# Patient Record
Sex: Female | Born: 1935 | Race: White | Hispanic: No | Marital: Married | Smoking: Former smoker
Health system: Southern US, Community
[De-identification: ages and names within clinical notes are randomized; demographics above are authoritative.]

## PROBLEM LIST (undated history)

## (undated) DIAGNOSIS — I4819 Other persistent atrial fibrillation: Secondary | ICD-10-CM

## (undated) DIAGNOSIS — H3530 Unspecified macular degeneration: Secondary | ICD-10-CM

## (undated) DIAGNOSIS — Q2112 Patent foramen ovale: Secondary | ICD-10-CM

## (undated) DIAGNOSIS — I340 Nonrheumatic mitral (valve) insufficiency: Secondary | ICD-10-CM

## (undated) DIAGNOSIS — Q211 Atrial septal defect: Secondary | ICD-10-CM

## (undated) DIAGNOSIS — N183 Chronic kidney disease, stage 3 (moderate): Secondary | ICD-10-CM

## (undated) DIAGNOSIS — Z8673 Personal history of transient ischemic attack (TIA), and cerebral infarction without residual deficits: Secondary | ICD-10-CM

## (undated) DIAGNOSIS — I5032 Chronic diastolic (congestive) heart failure: Principal | ICD-10-CM

## (undated) HISTORY — DX: Chronic diastolic (congestive) heart failure: I50.32

## (undated) HISTORY — DX: Patent foramen ovale: Q21.12

## (undated) HISTORY — DX: Other persistent atrial fibrillation: I48.19

## (undated) HISTORY — DX: Nonrheumatic mitral (valve) insufficiency: I34.0

## (undated) HISTORY — DX: Chronic kidney disease, stage 3 (moderate): N18.3

## (undated) HISTORY — PX: HERNIA REPAIR: SHX51

## (undated) HISTORY — DX: Personal history of transient ischemic attack (TIA), and cerebral infarction without residual deficits: Z86.73

## (undated) HISTORY — DX: Atrial septal defect: Q21.1

---

## 1997-05-04 ENCOUNTER — Other Ambulatory Visit: Admission: RE | Admit: 1997-05-04 | Discharge: 1997-05-04 | Payer: Self-pay | Admitting: Obstetrics and Gynecology

## 1998-12-10 ENCOUNTER — Encounter: Admission: RE | Admit: 1998-12-10 | Discharge: 1998-12-10 | Payer: Self-pay | Admitting: Obstetrics and Gynecology

## 1998-12-10 ENCOUNTER — Encounter: Payer: Self-pay | Admitting: Obstetrics and Gynecology

## 1998-12-31 ENCOUNTER — Other Ambulatory Visit: Admission: RE | Admit: 1998-12-31 | Discharge: 1998-12-31 | Payer: Self-pay | Admitting: Obstetrics and Gynecology

## 1999-08-08 ENCOUNTER — Inpatient Hospital Stay (HOSPITAL_COMMUNITY): Admission: AD | Admit: 1999-08-08 | Discharge: 1999-08-13 | Payer: Self-pay | Admitting: Internal Medicine

## 1999-08-08 ENCOUNTER — Encounter: Payer: Self-pay | Admitting: Internal Medicine

## 1999-12-16 ENCOUNTER — Encounter: Admission: RE | Admit: 1999-12-16 | Discharge: 1999-12-16 | Payer: Self-pay | Admitting: Obstetrics and Gynecology

## 1999-12-16 ENCOUNTER — Encounter: Payer: Self-pay | Admitting: Obstetrics and Gynecology

## 2000-02-10 ENCOUNTER — Other Ambulatory Visit: Admission: RE | Admit: 2000-02-10 | Discharge: 2000-02-10 | Payer: Self-pay | Admitting: Obstetrics and Gynecology

## 2000-12-07 ENCOUNTER — Encounter: Payer: Self-pay | Admitting: Obstetrics and Gynecology

## 2000-12-07 ENCOUNTER — Encounter: Admission: RE | Admit: 2000-12-07 | Discharge: 2000-12-07 | Payer: Self-pay | Admitting: Obstetrics and Gynecology

## 2001-04-25 ENCOUNTER — Other Ambulatory Visit: Admission: RE | Admit: 2001-04-25 | Discharge: 2001-04-25 | Payer: Self-pay | Admitting: Obstetrics and Gynecology

## 2001-12-28 ENCOUNTER — Encounter: Admission: RE | Admit: 2001-12-28 | Discharge: 2001-12-28 | Payer: Self-pay | Admitting: Obstetrics and Gynecology

## 2001-12-28 ENCOUNTER — Encounter: Payer: Self-pay | Admitting: Obstetrics and Gynecology

## 2002-04-13 ENCOUNTER — Encounter: Admission: RE | Admit: 2002-04-13 | Discharge: 2002-04-13 | Payer: Self-pay | Admitting: Internal Medicine

## 2002-04-13 ENCOUNTER — Encounter: Payer: Self-pay | Admitting: Internal Medicine

## 2002-11-02 ENCOUNTER — Ambulatory Visit (HOSPITAL_COMMUNITY): Admission: RE | Admit: 2002-11-02 | Discharge: 2002-11-02 | Payer: Self-pay | Admitting: Gastroenterology

## 2003-02-07 ENCOUNTER — Encounter: Admission: RE | Admit: 2003-02-07 | Discharge: 2003-02-07 | Payer: Self-pay | Admitting: Obstetrics and Gynecology

## 2004-01-27 HISTORY — PX: PARTIAL HYSTERECTOMY: SHX80

## 2004-02-04 ENCOUNTER — Ambulatory Visit (HOSPITAL_COMMUNITY): Admission: RE | Admit: 2004-02-04 | Discharge: 2004-02-04 | Payer: Self-pay | Admitting: Gastroenterology

## 2004-03-13 ENCOUNTER — Encounter: Admission: RE | Admit: 2004-03-13 | Discharge: 2004-03-13 | Payer: Self-pay | Admitting: Obstetrics and Gynecology

## 2005-02-03 ENCOUNTER — Ambulatory Visit (HOSPITAL_COMMUNITY): Admission: RE | Admit: 2005-02-03 | Discharge: 2005-02-03 | Payer: Self-pay | Admitting: Gastroenterology

## 2005-03-16 ENCOUNTER — Encounter: Admission: RE | Admit: 2005-03-16 | Discharge: 2005-03-16 | Payer: Self-pay | Admitting: Obstetrics and Gynecology

## 2005-12-09 ENCOUNTER — Ambulatory Visit (HOSPITAL_COMMUNITY): Admission: RE | Admit: 2005-12-09 | Discharge: 2005-12-09 | Payer: Self-pay | Admitting: Obstetrics and Gynecology

## 2006-01-06 ENCOUNTER — Ambulatory Visit: Admission: RE | Admit: 2006-01-06 | Discharge: 2006-01-06 | Payer: Self-pay | Admitting: Gynecology

## 2006-01-12 ENCOUNTER — Encounter (INDEPENDENT_AMBULATORY_CARE_PROVIDER_SITE_OTHER): Payer: Self-pay | Admitting: *Deleted

## 2006-01-12 ENCOUNTER — Inpatient Hospital Stay (HOSPITAL_COMMUNITY): Admission: RE | Admit: 2006-01-12 | Discharge: 2006-01-15 | Payer: Self-pay | Admitting: Obstetrics and Gynecology

## 2006-02-24 ENCOUNTER — Ambulatory Visit: Admission: RE | Admit: 2006-02-24 | Discharge: 2006-02-24 | Payer: Self-pay | Admitting: Gynecologic Oncology

## 2006-03-23 ENCOUNTER — Encounter: Admission: RE | Admit: 2006-03-23 | Discharge: 2006-03-23 | Payer: Self-pay | Admitting: Obstetrics and Gynecology

## 2006-05-10 ENCOUNTER — Encounter: Admission: RE | Admit: 2006-05-10 | Discharge: 2006-05-10 | Payer: Self-pay | Admitting: Internal Medicine

## 2006-05-18 ENCOUNTER — Encounter: Admission: RE | Admit: 2006-05-18 | Discharge: 2006-05-18 | Payer: Self-pay | Admitting: Internal Medicine

## 2006-06-02 ENCOUNTER — Inpatient Hospital Stay (HOSPITAL_COMMUNITY): Admission: RE | Admit: 2006-06-02 | Discharge: 2006-06-03 | Payer: Self-pay | Admitting: Gastroenterology

## 2007-04-07 ENCOUNTER — Encounter: Admission: RE | Admit: 2007-04-07 | Discharge: 2007-04-07 | Payer: Self-pay | Admitting: Obstetrics and Gynecology

## 2008-04-10 ENCOUNTER — Encounter: Admission: RE | Admit: 2008-04-10 | Discharge: 2008-04-10 | Payer: Self-pay | Admitting: Obstetrics and Gynecology

## 2009-04-24 ENCOUNTER — Encounter: Admission: RE | Admit: 2009-04-24 | Discharge: 2009-04-24 | Payer: Self-pay | Admitting: Obstetrics and Gynecology

## 2010-04-08 ENCOUNTER — Other Ambulatory Visit: Payer: Self-pay | Admitting: Obstetrics and Gynecology

## 2010-04-08 DIAGNOSIS — Z1231 Encounter for screening mammogram for malignant neoplasm of breast: Secondary | ICD-10-CM

## 2010-04-29 ENCOUNTER — Ambulatory Visit
Admission: RE | Admit: 2010-04-29 | Discharge: 2010-04-29 | Disposition: A | Discharge status: Home / Self Care | Payer: Medicare Other | Source: Ambulatory Visit | Attending: Obstetrics and Gynecology | Admitting: Obstetrics and Gynecology

## 2010-04-29 DIAGNOSIS — Z1231 Encounter for screening mammogram for malignant neoplasm of breast: Secondary | ICD-10-CM

## 2010-04-30 ENCOUNTER — Other Ambulatory Visit: Payer: Self-pay | Admitting: Obstetrics and Gynecology

## 2010-04-30 DIAGNOSIS — R928 Other abnormal and inconclusive findings on diagnostic imaging of breast: Secondary | ICD-10-CM

## 2010-05-08 ENCOUNTER — Ambulatory Visit
Admission: RE | Admit: 2010-05-08 | Discharge: 2010-05-08 | Disposition: A | Discharge status: Home / Self Care | Payer: Medicare Other | Source: Ambulatory Visit | Attending: Obstetrics and Gynecology | Admitting: Obstetrics and Gynecology

## 2010-05-08 DIAGNOSIS — R928 Other abnormal and inconclusive findings on diagnostic imaging of breast: Secondary | ICD-10-CM

## 2010-06-13 NOTE — Consult Note (Signed)
NAME:  Laura Diaz, Laura Diaz NO.:  [id number]   MEDICAL RECORD NO.:  [id number]         PATIENT TYPE:  LOUT   LOCATION:                               FACILITY:  [facility]   PHYSICIAN:  Paola A. Duard Brady, MD    DATE OF BIRTH:  05-Jul-1922   DATE OF CONSULTATION:  02/24/2006  DATE OF DISCHARGE:                                 CONSULTATION   Laura Diaz is an 77 year old who underwent a TAH-BSO January 06, 2006 by  Dr. Stanford Breed.  She had a pelvic mass noted at the time of her  routine GYN care by Dr. Ambrose Mantle.  Ultrasound and CT scan revealed a 4.5 x  5 cm complex mass.  There is also a questionable right external iliac  lymph node versus fluid density measuring 3.8 x 2.6 cm.  The patient was  essentially asymptomatic.  Her CA-125 was 8.4.  Because of the complex  nature of the postmenopausal patient she underwent exploratory  laparotomy with TAH-BSO.  Final pathology was consistent within the  right ovary.  A serous cyst adenofibroma measuring 6.5 cm.  She had a  benign fallopian tube with a paratubal cyst on the right side.  The  cervix revealed chronic cervicitis, no intraepithelial lesion, the  endometrium was an active with benign endometrial polyps measuring 1.2  cm and multiple myomas.  The left ovary and tube were negative.  Washings were unremarkable.  She comes in, today, for her postoperative  check.  She states she feels about 70-80% of herself and really denies  any significant complaints.  She denied any chest pain, short of breath,  nausea, vomiting, fevers, chills, or pain.  She states that the biggest  issues is fatigue.  She has normal return of her bowel and bladder  function; and denies any vaginal bleeding.   PHYSICAL EXAMINATION:  VITAL SIGNS:  Weight 176 pounds, blood pressure  160/100.  GENERAL:  A well-nourished, well-developed female in no acute distress.  ABDOMEN:  Soft and nontender.  PELVIC:  External genitalia is within normal limits though  atrophic.  The vagina is markedly atrophic.  The vaginal cuff is visualized.  It is  well-healed.  Bimanual examination reveals no masses or nodularity.   ASSESSMENT:  An 77 year old status post TAH-BSO for benign disease.   PLAN:  1. She can return to her usual activities.  I have encouraged to do no      heavy lifting of greater than 10 pounds an additional month to      decrease the hernia risk.  She will be followed up for her routine      surveillance by Dr. Ambrose Mantle for routine GYN care.  2. Her blood pressure is elevated today.  She is currently not on any      antihypertensive, I have recommended that she follow up with her      primary physician regarding this; and she will do so.  Her      questions were elicited and answered to her satisfaction.  She was      seen with her  daughter present.      Paola A. Duard Brady, MD  Electronically Signed     PAG/MEDQ  D:  02/24/2006  T:  02/24/2006  Job:  [id number]   cc:   Malachi Pro. Ambrose Mantle, M.D.  Fax: [phone]   Theressa Millard, M.D.  Fax: [phone]   Telford Nab, R.N.  [address]  [place], [place] [address]

## 2010-06-13 NOTE — Discharge Summary (Signed)
NAME:  MANREET, KIERNAN NO.:  [id number]   MEDICAL RECORD NO.:  [id number]          PATIENT TYPE:  INP   LOCATION:  1612                         FACILITY:  [facility]   PHYSICIAN:  Malachi Pro. Ambrose Mantle, M.D. DATE OF BIRTH:  May 01, 1922   DATE OF ADMISSION:  01/12/2006  DATE OF DISCHARGE:  01/15/2006                               DISCHARGE SUMMARY   This is an 77 year old white female admitted for surgical exploration of  a pelvic mass.  The patient underwent abdominal hysterectomy, bilateral  salpingo-oophorectomy by Dr. Serita Kyle with Dr. Ambrose Mantle  assisting under general anesthesia.  Frozen section on the enlarged  ovary showed a serous cyst adenofibroma, no evidence of malignancy.  The  patient has done well postoperatively.  She has remained afebrile, and  her abdomen has remained soft and nontender.  She has tolerated liquids  and some diet and some solid food and is ready for discharge on the  third postop day.  The incision is healing well.  It is a midline  incision from the pubis to the umbilicus.  There is some surrounding  ecchymosis, no sign of infection.  Her initial hemoglobin was 15.8,  hematocrit 45.3, white count 4600, platelet count 226,000.  Follow-up  hemoglobin 12.8, normal differential.  Comprehensive metabolic profile  on admission was normal.  Blood group and type was A+ with a negative  antibody.  Path report showed right ovary and fallopian tube, ovary with  serous cyst adenofibroma 6.5 cm, benign fallopian tube with paratubal  cyst.  Uterus, cervix, left ovary, and fallopian tube showed chronic  cervicitis with squamous metaplasia on the cervix.  No intraepithelial  lesion identified.  Endometrium, inactive endometrium with benign  endometrial polyp of 1.2 cm.  Myometrium with adenomyosis and  leiomyomas, left ovary with inclusion cysts, surface adhesions, and  benign fallopian tube.  Peritoneal washing showed no malignant cells.  Chest  x-ray showed mild cardiomegaly, bibasilar opacities, likely  representing atelectasis, but particularly on the lateral projection are  nonspecific.  Correlate with any fever or leukocytosis in assessing for  the possibility of early pneumonia.  Recommend followup chest  radiography to ensure clearance.  If the patient has a personal history  of malignancy, I would recommend CT.  EKG showed sinus bradycardia, low  voltage QRS septal infarct age undetermined.   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   OPERATION:  Abdominal hysterectomy, bilateral salpingo-oophorectomy.   FINAL CONDITION:  Improved.   INSTRUCTIONS:  No vaginal entrance, no heavy lifting or strenuous  activity.  Call with any fever greater than 100.4 degrees.  Call with  any unusual problems.  The patient is to resume her Zyrtec, Nasonex,  Singulair, calcium, vitamin D, and Centrum Silver as directed.  She is  to resume her Coumadin on the regular dosage schedule.  She takes 2.5 mg  every day except Thursdays and Sundays when she takes 5 mg.  She is to  follow up with me in less than 1 week to have her staples removed unless  arrangements are made for  her to be followed up by Dr. Marcene Corning-  Sharol Given and his staff.  Percocet 5/325, 36 tablets 1 every 4-6 hours as  needed for pain is given at discharge.      Malachi Pro. Ambrose Mantle, M.D.  Electronically Signed    TFH/MEDQ  D:  01/15/2006  T:  01/15/2006  Job:  [id number]

## 2010-06-13 NOTE — Op Note (Signed)
NAME:  ISRAELLA, HUBERT NO.:  [id number]   MEDICAL RECORD NO.:  [id number]          PATIENT TYPE:  INP   LOCATION:  0002                         FACILITY:  [facility]   PHYSICIAN:  De Blanch, M.D.DATE OF BIRTH:  1922/06/08   DATE OF PROCEDURE:  01/12/2006  DATE OF DISCHARGE:                               OPERATIVE REPORT   PREOPERATIVE DIAGNOSIS:  Complex right adnexal mass.   POSTOPERATIVE DIAGNOSIS:  Serous cyst adenoma of the right ovary.   PROCEDURE:  Total abdominal hysterectomy, bilateral salpingo-  oophorectomy.   SURGEON:  De Blanch, M.D.   FIRST ASSISTANT:  Tracey Harries, M.D., Telford Nab, R.N.   ANESTHESIA:  General with orotracheal tube.   ESTIMATED BLOOD LOSS:  50 mL.   SURGICAL FINDINGS:  At the time of exploratory laparotomy, the upper  abdomen including the liver, spleen, stomach, omentum and diaphragms  were normal.  The right ovary was replaced by a smooth cystic mass  approximately 6 cm in diameter.  This was freely mobile.  On frozen  section, this was determined be a serous cyst adenofibroma.   The uterus and other tube and ovary appeared normal.  There was no  evidence of adenopathy.   DESCRIPTION OF PROCEDURE:  The patient was brought to the operating room  and after satisfactory attainment of general anesthesia was placed in  the modified lithotomy position in [place] stirrups.  The anterior  abdominal wall, perineum and vagina were prepped with Betadine, a Foley  catheter was inserted and the patient was draped.  The abdomen was  entered through a low midline incision.  Peritoneal washings were  obtained.  The upper abdomen and pelvis were explored with the above-  noted findings.  The uterus was grasped with long Kelly clamps and the  round ligaments were divided.  The retroperitoneal spaces were opened.  The previously described enlarged right pelvic lymph node could not be  identified at the time of  retroperitoneal exploration.  The ureter was  identified.  The ovarian vessels were skeletonized, clamped, cut, free  tied and suture ligated.  The right cystic ovary and tube were doubly  clamped at the uterine cornu, transected and submitted to frozen section  with the above-noted findings.  A similar procedure was performed on the  left side of the pelvis.  The bladder flap was advanced with sharp and  blunt dissection.  The uterine vessels were skeletonized, clamped, cut  and suture ligated. In a stepwise fashion, the paracervical and cardinal  ligaments and vaginal angles were clamped, cut and suture ligated.  The  vagina was transected from its junction with the cervix.  The vaginal  angles were transfixed with #0 Vicryl and the central portion of the  vagina closed with interrupted figure-of-eight sutures of #0 Vicryl.  The pelvis was irrigated and found to be hemostatic.  Packs and  retractors were removed.  The anterior abdominal wall was then closed in  layers, the first being a running mass closure using #1 PDS  incorporating fascia muscle and peritoneum.  The subcutaneous  tissue was irrigated,  hemostasis achieved with cautery.  The skin was  closed with skin staples.  A dressing was applied.  The patient was  awakened from anesthesia, and taken to the recovery room in satisfactory  condition.  Sponge, needle and instrument counts correct x2.      De Blanch, M.D.  Electronically Signed     DC/MEDQ  D:  01/12/2006  T:  01/12/2006  Job:  [id number]   cc:   Malachi Pro. Ambrose Mantle, M.D.  Fax: [phone]   Telford Nab, R.N.  [address]  [place], [place] [address]

## 2010-11-18 ENCOUNTER — Other Ambulatory Visit: Payer: Self-pay | Admitting: Dermatology

## 2011-02-02 DIAGNOSIS — Z7901 Long term (current) use of anticoagulants: Secondary | ICD-10-CM | POA: Diagnosis not present

## 2011-03-02 DIAGNOSIS — Z7901 Long term (current) use of anticoagulants: Secondary | ICD-10-CM | POA: Diagnosis not present

## 2011-03-30 DIAGNOSIS — Z7901 Long term (current) use of anticoagulants: Secondary | ICD-10-CM | POA: Diagnosis not present

## 2011-05-04 DIAGNOSIS — Z7901 Long term (current) use of anticoagulants: Secondary | ICD-10-CM | POA: Diagnosis not present

## 2011-05-25 ENCOUNTER — Other Ambulatory Visit: Payer: Self-pay | Admitting: Obstetrics and Gynecology

## 2011-05-25 DIAGNOSIS — Z1231 Encounter for screening mammogram for malignant neoplasm of breast: Secondary | ICD-10-CM

## 2011-06-02 ENCOUNTER — Ambulatory Visit: Payer: Medicare Other

## 2011-06-11 DIAGNOSIS — Z9181 History of falling: Secondary | ICD-10-CM | POA: Diagnosis not present

## 2011-06-11 DIAGNOSIS — F05 Delirium due to known physiological condition: Secondary | ICD-10-CM | POA: Diagnosis not present

## 2011-06-11 DIAGNOSIS — Z7901 Long term (current) use of anticoagulants: Secondary | ICD-10-CM | POA: Diagnosis not present

## 2011-06-11 DIAGNOSIS — H9190 Unspecified hearing loss, unspecified ear: Secondary | ICD-10-CM | POA: Diagnosis not present

## 2011-06-11 DIAGNOSIS — F329 Major depressive disorder, single episode, unspecified: Secondary | ICD-10-CM | POA: Diagnosis not present

## 2011-06-11 DIAGNOSIS — M545 Low back pain, unspecified: Secondary | ICD-10-CM | POA: Diagnosis not present

## 2011-06-11 DIAGNOSIS — J309 Allergic rhinitis, unspecified: Secondary | ICD-10-CM | POA: Diagnosis not present

## 2011-06-11 DIAGNOSIS — E7800 Pure hypercholesterolemia, unspecified: Secondary | ICD-10-CM | POA: Diagnosis not present

## 2011-06-16 ENCOUNTER — Ambulatory Visit
Admission: RE | Admit: 2011-06-16 | Discharge: 2011-06-16 | Disposition: A | Discharge status: Home / Self Care | Payer: Medicare Other | Source: Ambulatory Visit | Attending: Obstetrics and Gynecology | Admitting: Obstetrics and Gynecology

## 2011-06-16 DIAGNOSIS — Z1231 Encounter for screening mammogram for malignant neoplasm of breast: Secondary | ICD-10-CM

## 2011-07-09 DIAGNOSIS — H6120 Impacted cerumen, unspecified ear: Secondary | ICD-10-CM | POA: Diagnosis not present

## 2011-07-09 DIAGNOSIS — H9190 Unspecified hearing loss, unspecified ear: Secondary | ICD-10-CM | POA: Diagnosis not present

## 2011-07-13 DIAGNOSIS — Z7901 Long term (current) use of anticoagulants: Secondary | ICD-10-CM | POA: Diagnosis not present

## 2011-08-10 DIAGNOSIS — Z7901 Long term (current) use of anticoagulants: Secondary | ICD-10-CM | POA: Diagnosis not present

## 2011-09-08 DIAGNOSIS — Z7901 Long term (current) use of anticoagulants: Secondary | ICD-10-CM | POA: Diagnosis not present

## 2011-09-14 DIAGNOSIS — M545 Low back pain, unspecified: Secondary | ICD-10-CM | POA: Diagnosis not present

## 2011-09-14 DIAGNOSIS — F05 Delirium due to known physiological condition: Secondary | ICD-10-CM | POA: Diagnosis not present

## 2011-09-14 DIAGNOSIS — F329 Major depressive disorder, single episode, unspecified: Secondary | ICD-10-CM | POA: Diagnosis not present

## 2011-10-06 DIAGNOSIS — Z7901 Long term (current) use of anticoagulants: Secondary | ICD-10-CM | POA: Diagnosis not present

## 2011-11-03 DIAGNOSIS — Z7901 Long term (current) use of anticoagulants: Secondary | ICD-10-CM | POA: Diagnosis not present

## 2011-11-25 DIAGNOSIS — Z7901 Long term (current) use of anticoagulants: Secondary | ICD-10-CM | POA: Diagnosis not present

## 2011-12-10 DIAGNOSIS — Z7901 Long term (current) use of anticoagulants: Secondary | ICD-10-CM | POA: Diagnosis not present

## 2012-01-07 DIAGNOSIS — Z7901 Long term (current) use of anticoagulants: Secondary | ICD-10-CM | POA: Diagnosis not present

## 2012-02-04 DIAGNOSIS — Z7901 Long term (current) use of anticoagulants: Secondary | ICD-10-CM | POA: Diagnosis not present

## 2012-03-14 DIAGNOSIS — Z7901 Long term (current) use of anticoagulants: Secondary | ICD-10-CM | POA: Diagnosis not present

## 2012-04-13 DIAGNOSIS — Z7901 Long term (current) use of anticoagulants: Secondary | ICD-10-CM | POA: Diagnosis not present

## 2012-04-13 DIAGNOSIS — E7800 Pure hypercholesterolemia, unspecified: Secondary | ICD-10-CM | POA: Diagnosis not present

## 2012-04-13 DIAGNOSIS — M549 Dorsalgia, unspecified: Secondary | ICD-10-CM | POA: Diagnosis not present

## 2012-04-13 DIAGNOSIS — Z79899 Other long term (current) drug therapy: Secondary | ICD-10-CM | POA: Diagnosis not present

## 2012-04-13 DIAGNOSIS — F325 Major depressive disorder, single episode, in full remission: Secondary | ICD-10-CM | POA: Diagnosis not present

## 2012-04-14 DIAGNOSIS — Z947 Corneal transplant status: Secondary | ICD-10-CM | POA: Diagnosis not present

## 2012-04-14 DIAGNOSIS — H04569 Stenosis of unspecified lacrimal punctum: Secondary | ICD-10-CM | POA: Diagnosis not present

## 2012-04-14 DIAGNOSIS — H18519 Endothelial corneal dystrophy, unspecified eye: Secondary | ICD-10-CM | POA: Diagnosis not present

## 2012-04-14 DIAGNOSIS — Z961 Presence of intraocular lens: Secondary | ICD-10-CM | POA: Diagnosis not present

## 2012-04-14 DIAGNOSIS — Z9849 Cataract extraction status, unspecified eye: Secondary | ICD-10-CM | POA: Diagnosis not present

## 2012-04-14 DIAGNOSIS — H02139 Senile ectropion of unspecified eye, unspecified eyelid: Secondary | ICD-10-CM | POA: Diagnosis not present

## 2012-04-14 DIAGNOSIS — H02109 Unspecified ectropion of unspecified eye, unspecified eyelid: Secondary | ICD-10-CM | POA: Diagnosis not present

## 2012-05-11 DIAGNOSIS — Z7901 Long term (current) use of anticoagulants: Secondary | ICD-10-CM | POA: Diagnosis not present

## 2012-05-17 DIAGNOSIS — Z01818 Encounter for other preprocedural examination: Secondary | ICD-10-CM | POA: Diagnosis not present

## 2012-05-17 DIAGNOSIS — H02139 Senile ectropion of unspecified eye, unspecified eyelid: Secondary | ICD-10-CM | POA: Diagnosis not present

## 2012-05-17 DIAGNOSIS — H02109 Unspecified ectropion of unspecified eye, unspecified eyelid: Secondary | ICD-10-CM | POA: Diagnosis not present

## 2012-05-17 DIAGNOSIS — H04569 Stenosis of unspecified lacrimal punctum: Secondary | ICD-10-CM | POA: Diagnosis not present

## 2012-05-23 DIAGNOSIS — H02139 Senile ectropion of unspecified eye, unspecified eyelid: Secondary | ICD-10-CM | POA: Diagnosis not present

## 2012-05-23 DIAGNOSIS — F329 Major depressive disorder, single episode, unspecified: Secondary | ICD-10-CM | POA: Diagnosis not present

## 2012-05-23 DIAGNOSIS — H04569 Stenosis of unspecified lacrimal punctum: Secondary | ICD-10-CM | POA: Diagnosis not present

## 2012-05-23 DIAGNOSIS — Z7901 Long term (current) use of anticoagulants: Secondary | ICD-10-CM | POA: Diagnosis not present

## 2012-05-23 DIAGNOSIS — Z8673 Personal history of transient ischemic attack (TIA), and cerebral infarction without residual deficits: Secondary | ICD-10-CM | POA: Diagnosis not present

## 2012-05-23 DIAGNOSIS — H02109 Unspecified ectropion of unspecified eye, unspecified eyelid: Secondary | ICD-10-CM | POA: Diagnosis not present

## 2012-05-23 DIAGNOSIS — F411 Generalized anxiety disorder: Secondary | ICD-10-CM | POA: Diagnosis not present

## 2012-05-31 DIAGNOSIS — H02139 Senile ectropion of unspecified eye, unspecified eyelid: Secondary | ICD-10-CM | POA: Diagnosis not present

## 2012-05-31 DIAGNOSIS — H04569 Stenosis of unspecified lacrimal punctum: Secondary | ICD-10-CM | POA: Diagnosis not present

## 2012-06-08 DIAGNOSIS — Z7901 Long term (current) use of anticoagulants: Secondary | ICD-10-CM | POA: Diagnosis not present

## 2012-06-29 ENCOUNTER — Other Ambulatory Visit: Payer: Self-pay

## 2012-06-29 DIAGNOSIS — Z1231 Encounter for screening mammogram for malignant neoplasm of breast: Secondary | ICD-10-CM

## 2012-07-06 DIAGNOSIS — Z7901 Long term (current) use of anticoagulants: Secondary | ICD-10-CM | POA: Diagnosis not present

## 2012-07-27 ENCOUNTER — Ambulatory Visit
Admission: RE | Admit: 2012-07-27 | Discharge: 2012-07-27 | Disposition: A | Discharge status: Home / Self Care | Payer: Medicare Other | Source: Ambulatory Visit

## 2012-07-27 DIAGNOSIS — Z1231 Encounter for screening mammogram for malignant neoplasm of breast: Secondary | ICD-10-CM

## 2012-08-04 DIAGNOSIS — Z7901 Long term (current) use of anticoagulants: Secondary | ICD-10-CM | POA: Diagnosis not present

## 2012-08-24 DIAGNOSIS — Z0000 Encounter for general adult medical examination without abnormal findings: Secondary | ICD-10-CM | POA: Diagnosis not present

## 2012-08-24 DIAGNOSIS — Z01419 Encounter for gynecological examination (general) (routine) without abnormal findings: Secondary | ICD-10-CM | POA: Diagnosis not present

## 2012-08-31 DIAGNOSIS — Z7901 Long term (current) use of anticoagulants: Secondary | ICD-10-CM | POA: Diagnosis not present

## 2012-09-21 DIAGNOSIS — Z7901 Long term (current) use of anticoagulants: Secondary | ICD-10-CM | POA: Diagnosis not present

## 2012-10-19 DIAGNOSIS — M545 Low back pain, unspecified: Secondary | ICD-10-CM | POA: Diagnosis not present

## 2012-10-19 DIAGNOSIS — J309 Allergic rhinitis, unspecified: Secondary | ICD-10-CM | POA: Diagnosis not present

## 2012-10-19 DIAGNOSIS — F325 Major depressive disorder, single episode, in full remission: Secondary | ICD-10-CM | POA: Diagnosis not present

## 2012-10-19 DIAGNOSIS — N183 Chronic kidney disease, stage 3 unspecified: Secondary | ICD-10-CM | POA: Diagnosis not present

## 2012-10-19 DIAGNOSIS — Z7901 Long term (current) use of anticoagulants: Secondary | ICD-10-CM | POA: Diagnosis not present

## 2012-11-14 DIAGNOSIS — Z7901 Long term (current) use of anticoagulants: Secondary | ICD-10-CM | POA: Diagnosis not present

## 2012-11-14 DIAGNOSIS — Z23 Encounter for immunization: Secondary | ICD-10-CM | POA: Diagnosis not present

## 2012-12-13 DIAGNOSIS — G459 Transient cerebral ischemic attack, unspecified: Secondary | ICD-10-CM | POA: Diagnosis not present

## 2012-12-13 DIAGNOSIS — Z7901 Long term (current) use of anticoagulants: Secondary | ICD-10-CM | POA: Diagnosis not present

## 2013-01-11 DIAGNOSIS — G459 Transient cerebral ischemic attack, unspecified: Secondary | ICD-10-CM | POA: Diagnosis not present

## 2013-01-11 DIAGNOSIS — Z7901 Long term (current) use of anticoagulants: Secondary | ICD-10-CM | POA: Diagnosis not present

## 2013-02-01 DIAGNOSIS — G459 Transient cerebral ischemic attack, unspecified: Secondary | ICD-10-CM | POA: Diagnosis not present

## 2013-02-01 DIAGNOSIS — Z7901 Long term (current) use of anticoagulants: Secondary | ICD-10-CM | POA: Diagnosis not present

## 2013-03-01 DIAGNOSIS — G459 Transient cerebral ischemic attack, unspecified: Secondary | ICD-10-CM | POA: Diagnosis not present

## 2013-03-01 DIAGNOSIS — Z7901 Long term (current) use of anticoagulants: Secondary | ICD-10-CM | POA: Diagnosis not present

## 2013-03-29 DIAGNOSIS — M545 Low back pain, unspecified: Secondary | ICD-10-CM | POA: Diagnosis not present

## 2013-03-29 DIAGNOSIS — G459 Transient cerebral ischemic attack, unspecified: Secondary | ICD-10-CM | POA: Diagnosis not present

## 2013-03-29 DIAGNOSIS — Z7901 Long term (current) use of anticoagulants: Secondary | ICD-10-CM | POA: Diagnosis not present

## 2013-04-19 DIAGNOSIS — M545 Low back pain, unspecified: Secondary | ICD-10-CM | POA: Diagnosis not present

## 2013-04-19 DIAGNOSIS — N183 Chronic kidney disease, stage 3 unspecified: Secondary | ICD-10-CM | POA: Diagnosis not present

## 2013-04-19 DIAGNOSIS — Z7901 Long term (current) use of anticoagulants: Secondary | ICD-10-CM | POA: Diagnosis not present

## 2013-04-19 DIAGNOSIS — F325 Major depressive disorder, single episode, in full remission: Secondary | ICD-10-CM | POA: Diagnosis not present

## 2013-04-19 DIAGNOSIS — Z23 Encounter for immunization: Secondary | ICD-10-CM | POA: Diagnosis not present

## 2013-05-18 DIAGNOSIS — I6340 Cerebral infarction due to embolism of unspecified cerebral artery: Secondary | ICD-10-CM | POA: Diagnosis not present

## 2013-05-18 DIAGNOSIS — Z7901 Long term (current) use of anticoagulants: Secondary | ICD-10-CM | POA: Diagnosis not present

## 2013-06-16 DIAGNOSIS — G459 Transient cerebral ischemic attack, unspecified: Secondary | ICD-10-CM | POA: Diagnosis not present

## 2013-06-16 DIAGNOSIS — Z7901 Long term (current) use of anticoagulants: Secondary | ICD-10-CM | POA: Diagnosis not present

## 2013-06-23 DIAGNOSIS — Z7901 Long term (current) use of anticoagulants: Secondary | ICD-10-CM | POA: Diagnosis not present

## 2013-06-23 DIAGNOSIS — I6340 Cerebral infarction due to embolism of unspecified cerebral artery: Secondary | ICD-10-CM | POA: Diagnosis not present

## 2013-07-07 DIAGNOSIS — Z7901 Long term (current) use of anticoagulants: Secondary | ICD-10-CM | POA: Diagnosis not present

## 2013-07-07 DIAGNOSIS — I6340 Cerebral infarction due to embolism of unspecified cerebral artery: Secondary | ICD-10-CM | POA: Diagnosis not present

## 2013-07-21 DIAGNOSIS — Z7901 Long term (current) use of anticoagulants: Secondary | ICD-10-CM | POA: Diagnosis not present

## 2013-07-21 DIAGNOSIS — I6340 Cerebral infarction due to embolism of unspecified cerebral artery: Secondary | ICD-10-CM | POA: Diagnosis not present

## 2013-07-26 DIAGNOSIS — J3489 Other specified disorders of nose and nasal sinuses: Secondary | ICD-10-CM | POA: Diagnosis not present

## 2013-07-26 DIAGNOSIS — R5381 Other malaise: Secondary | ICD-10-CM | POA: Diagnosis not present

## 2013-07-26 DIAGNOSIS — R5383 Other fatigue: Secondary | ICD-10-CM | POA: Diagnosis not present

## 2013-07-26 DIAGNOSIS — F05 Delirium due to known physiological condition: Secondary | ICD-10-CM | POA: Diagnosis not present

## 2013-08-09 DIAGNOSIS — Z5181 Encounter for therapeutic drug level monitoring: Secondary | ICD-10-CM | POA: Diagnosis not present

## 2013-08-09 DIAGNOSIS — Z7901 Long term (current) use of anticoagulants: Secondary | ICD-10-CM | POA: Diagnosis not present

## 2013-08-23 DIAGNOSIS — Z7901 Long term (current) use of anticoagulants: Secondary | ICD-10-CM | POA: Diagnosis not present

## 2013-08-23 DIAGNOSIS — I6340 Cerebral infarction due to embolism of unspecified cerebral artery: Secondary | ICD-10-CM | POA: Diagnosis not present

## 2013-09-20 DIAGNOSIS — I6340 Cerebral infarction due to embolism of unspecified cerebral artery: Secondary | ICD-10-CM | POA: Diagnosis not present

## 2013-09-20 DIAGNOSIS — Z7901 Long term (current) use of anticoagulants: Secondary | ICD-10-CM | POA: Diagnosis not present

## 2013-10-17 DIAGNOSIS — Z23 Encounter for immunization: Secondary | ICD-10-CM | POA: Diagnosis not present

## 2013-10-17 DIAGNOSIS — R5381 Other malaise: Secondary | ICD-10-CM | POA: Diagnosis not present

## 2013-10-17 DIAGNOSIS — Z7901 Long term (current) use of anticoagulants: Secondary | ICD-10-CM | POA: Diagnosis not present

## 2013-10-17 DIAGNOSIS — F329 Major depressive disorder, single episode, unspecified: Secondary | ICD-10-CM | POA: Diagnosis not present

## 2013-10-17 DIAGNOSIS — I6340 Cerebral infarction due to embolism of unspecified cerebral artery: Secondary | ICD-10-CM | POA: Diagnosis not present

## 2013-10-17 DIAGNOSIS — N390 Urinary tract infection, site not specified: Secondary | ICD-10-CM | POA: Diagnosis not present

## 2013-10-17 DIAGNOSIS — R5383 Other fatigue: Secondary | ICD-10-CM | POA: Diagnosis not present

## 2013-10-23 DIAGNOSIS — R5383 Other fatigue: Secondary | ICD-10-CM | POA: Diagnosis not present

## 2013-10-23 DIAGNOSIS — N183 Chronic kidney disease, stage 3 unspecified: Secondary | ICD-10-CM | POA: Diagnosis not present

## 2013-10-23 DIAGNOSIS — F05 Delirium due to known physiological condition: Secondary | ICD-10-CM | POA: Diagnosis not present

## 2013-10-23 DIAGNOSIS — F325 Major depressive disorder, single episode, in full remission: Secondary | ICD-10-CM | POA: Diagnosis not present

## 2013-10-23 DIAGNOSIS — R5381 Other malaise: Secondary | ICD-10-CM | POA: Diagnosis not present

## 2013-10-23 DIAGNOSIS — E7800 Pure hypercholesterolemia, unspecified: Secondary | ICD-10-CM | POA: Diagnosis not present

## 2013-10-23 DIAGNOSIS — IMO0002 Reserved for concepts with insufficient information to code with codable children: Secondary | ICD-10-CM | POA: Diagnosis not present

## 2013-11-07 DIAGNOSIS — Z7901 Long term (current) use of anticoagulants: Secondary | ICD-10-CM | POA: Diagnosis not present

## 2013-11-07 DIAGNOSIS — I639 Cerebral infarction, unspecified: Secondary | ICD-10-CM | POA: Diagnosis not present

## 2013-11-10 DIAGNOSIS — H3532 Exudative age-related macular degeneration: Secondary | ICD-10-CM | POA: Diagnosis not present

## 2013-11-10 DIAGNOSIS — H43811 Vitreous degeneration, right eye: Secondary | ICD-10-CM | POA: Diagnosis not present

## 2013-11-10 DIAGNOSIS — H3531 Nonexudative age-related macular degeneration: Secondary | ICD-10-CM | POA: Diagnosis not present

## 2013-11-13 DIAGNOSIS — M6281 Muscle weakness (generalized): Secondary | ICD-10-CM | POA: Diagnosis not present

## 2013-11-13 DIAGNOSIS — N183 Chronic kidney disease, stage 3 (moderate): Secondary | ICD-10-CM | POA: Diagnosis not present

## 2013-11-13 DIAGNOSIS — F0390 Unspecified dementia without behavioral disturbance: Secondary | ICD-10-CM | POA: Diagnosis not present

## 2013-11-13 DIAGNOSIS — Z7901 Long term (current) use of anticoagulants: Secondary | ICD-10-CM | POA: Diagnosis not present

## 2013-11-13 DIAGNOSIS — Z8673 Personal history of transient ischemic attack (TIA), and cerebral infarction without residual deficits: Secondary | ICD-10-CM | POA: Diagnosis not present

## 2013-11-14 DIAGNOSIS — Z7901 Long term (current) use of anticoagulants: Secondary | ICD-10-CM | POA: Diagnosis not present

## 2013-11-14 DIAGNOSIS — I639 Cerebral infarction, unspecified: Secondary | ICD-10-CM | POA: Diagnosis not present

## 2013-11-20 DIAGNOSIS — F0390 Unspecified dementia without behavioral disturbance: Secondary | ICD-10-CM | POA: Diagnosis not present

## 2013-11-20 DIAGNOSIS — Z8673 Personal history of transient ischemic attack (TIA), and cerebral infarction without residual deficits: Secondary | ICD-10-CM | POA: Diagnosis not present

## 2013-11-20 DIAGNOSIS — M6281 Muscle weakness (generalized): Secondary | ICD-10-CM | POA: Diagnosis not present

## 2013-11-20 DIAGNOSIS — N183 Chronic kidney disease, stage 3 (moderate): Secondary | ICD-10-CM | POA: Diagnosis not present

## 2013-11-20 DIAGNOSIS — Z7901 Long term (current) use of anticoagulants: Secondary | ICD-10-CM | POA: Diagnosis not present

## 2013-11-23 DIAGNOSIS — Z8673 Personal history of transient ischemic attack (TIA), and cerebral infarction without residual deficits: Secondary | ICD-10-CM | POA: Diagnosis not present

## 2013-11-23 DIAGNOSIS — N183 Chronic kidney disease, stage 3 (moderate): Secondary | ICD-10-CM | POA: Diagnosis not present

## 2013-11-23 DIAGNOSIS — F0390 Unspecified dementia without behavioral disturbance: Secondary | ICD-10-CM | POA: Diagnosis not present

## 2013-11-23 DIAGNOSIS — M6281 Muscle weakness (generalized): Secondary | ICD-10-CM | POA: Diagnosis not present

## 2013-11-23 DIAGNOSIS — Z7901 Long term (current) use of anticoagulants: Secondary | ICD-10-CM | POA: Diagnosis not present

## 2013-11-27 DIAGNOSIS — H3532 Exudative age-related macular degeneration: Secondary | ICD-10-CM | POA: Diagnosis not present

## 2013-11-29 DIAGNOSIS — Z7901 Long term (current) use of anticoagulants: Secondary | ICD-10-CM | POA: Diagnosis not present

## 2013-11-29 DIAGNOSIS — M6281 Muscle weakness (generalized): Secondary | ICD-10-CM | POA: Diagnosis not present

## 2013-11-29 DIAGNOSIS — Z8673 Personal history of transient ischemic attack (TIA), and cerebral infarction without residual deficits: Secondary | ICD-10-CM | POA: Diagnosis not present

## 2013-11-29 DIAGNOSIS — F0390 Unspecified dementia without behavioral disturbance: Secondary | ICD-10-CM | POA: Diagnosis not present

## 2013-11-29 DIAGNOSIS — N183 Chronic kidney disease, stage 3 (moderate): Secondary | ICD-10-CM | POA: Diagnosis not present

## 2013-12-12 DIAGNOSIS — I639 Cerebral infarction, unspecified: Secondary | ICD-10-CM | POA: Diagnosis not present

## 2013-12-12 DIAGNOSIS — Z7901 Long term (current) use of anticoagulants: Secondary | ICD-10-CM | POA: Diagnosis not present

## 2014-01-03 DIAGNOSIS — H3532 Exudative age-related macular degeneration: Secondary | ICD-10-CM | POA: Diagnosis not present

## 2014-01-03 DIAGNOSIS — H3531 Nonexudative age-related macular degeneration: Secondary | ICD-10-CM | POA: Diagnosis not present

## 2014-01-12 DIAGNOSIS — Z7901 Long term (current) use of anticoagulants: Secondary | ICD-10-CM | POA: Diagnosis not present

## 2014-01-12 DIAGNOSIS — I639 Cerebral infarction, unspecified: Secondary | ICD-10-CM | POA: Diagnosis not present

## 2014-01-31 DIAGNOSIS — I639 Cerebral infarction, unspecified: Secondary | ICD-10-CM | POA: Diagnosis not present

## 2014-01-31 DIAGNOSIS — Z7901 Long term (current) use of anticoagulants: Secondary | ICD-10-CM | POA: Diagnosis not present

## 2014-02-02 DIAGNOSIS — H3531 Nonexudative age-related macular degeneration: Secondary | ICD-10-CM | POA: Diagnosis not present

## 2014-02-02 DIAGNOSIS — H3532 Exudative age-related macular degeneration: Secondary | ICD-10-CM | POA: Diagnosis not present

## 2014-02-02 DIAGNOSIS — H43811 Vitreous degeneration, right eye: Secondary | ICD-10-CM | POA: Diagnosis not present

## 2014-03-01 DIAGNOSIS — Z7901 Long term (current) use of anticoagulants: Secondary | ICD-10-CM | POA: Diagnosis not present

## 2014-03-01 DIAGNOSIS — I639 Cerebral infarction, unspecified: Secondary | ICD-10-CM | POA: Diagnosis not present

## 2014-03-08 DIAGNOSIS — H5212 Myopia, left eye: Secondary | ICD-10-CM | POA: Diagnosis not present

## 2014-03-08 DIAGNOSIS — Z961 Presence of intraocular lens: Secondary | ICD-10-CM | POA: Diagnosis not present

## 2014-03-08 DIAGNOSIS — H3532 Exudative age-related macular degeneration: Secondary | ICD-10-CM | POA: Diagnosis not present

## 2014-03-08 DIAGNOSIS — H3531 Nonexudative age-related macular degeneration: Secondary | ICD-10-CM | POA: Diagnosis not present

## 2014-03-16 ENCOUNTER — Encounter (HOSPITAL_COMMUNITY): Payer: Self-pay | Admitting: Emergency Medicine

## 2014-03-16 ENCOUNTER — Emergency Department (HOSPITAL_COMMUNITY): Payer: Medicare Other

## 2014-03-16 ENCOUNTER — Inpatient Hospital Stay (HOSPITAL_COMMUNITY)
Admission: EM | Admit: 2014-03-16 | Discharge: 2014-03-19 | DRG: 064 | Disposition: A | Discharge status: Home Health | Payer: Medicare Other | Attending: Internal Medicine | Admitting: Internal Medicine

## 2014-03-16 DIAGNOSIS — Z8673 Personal history of transient ischemic attack (TIA), and cerebral infarction without residual deficits: Secondary | ICD-10-CM | POA: Diagnosis present

## 2014-03-16 DIAGNOSIS — Z7901 Long term (current) use of anticoagulants: Secondary | ICD-10-CM

## 2014-03-16 DIAGNOSIS — E119 Type 2 diabetes mellitus without complications: Secondary | ICD-10-CM | POA: Diagnosis present

## 2014-03-16 DIAGNOSIS — F0390 Unspecified dementia without behavioral disturbance: Secondary | ICD-10-CM | POA: Diagnosis present

## 2014-03-16 DIAGNOSIS — H3530 Unspecified macular degeneration: Secondary | ICD-10-CM | POA: Diagnosis present

## 2014-03-16 DIAGNOSIS — Z6832 Body mass index (BMI) 32.0-32.9, adult: Secondary | ICD-10-CM | POA: Diagnosis not present

## 2014-03-16 DIAGNOSIS — F0391 Unspecified dementia with behavioral disturbance: Secondary | ICD-10-CM

## 2014-03-16 DIAGNOSIS — N289 Disorder of kidney and ureter, unspecified: Secondary | ICD-10-CM | POA: Diagnosis present

## 2014-03-16 DIAGNOSIS — R410 Disorientation, unspecified: Secondary | ICD-10-CM | POA: Diagnosis not present

## 2014-03-16 DIAGNOSIS — I6789 Other cerebrovascular disease: Secondary | ICD-10-CM | POA: Diagnosis not present

## 2014-03-16 DIAGNOSIS — R001 Bradycardia, unspecified: Secondary | ICD-10-CM | POA: Diagnosis present

## 2014-03-16 DIAGNOSIS — Z79899 Other long term (current) drug therapy: Secondary | ICD-10-CM | POA: Diagnosis not present

## 2014-03-16 DIAGNOSIS — E669 Obesity, unspecified: Secondary | ICD-10-CM | POA: Diagnosis present

## 2014-03-16 DIAGNOSIS — I441 Atrioventricular block, second degree: Secondary | ICD-10-CM | POA: Diagnosis present

## 2014-03-16 DIAGNOSIS — G9340 Encephalopathy, unspecified: Secondary | ICD-10-CM | POA: Diagnosis present

## 2014-03-16 DIAGNOSIS — E785 Hyperlipidemia, unspecified: Secondary | ICD-10-CM | POA: Diagnosis not present

## 2014-03-16 DIAGNOSIS — F05 Delirium due to known physiological condition: Secondary | ICD-10-CM | POA: Diagnosis not present

## 2014-03-16 DIAGNOSIS — G4751 Confusional arousals: Secondary | ICD-10-CM | POA: Diagnosis not present

## 2014-03-16 DIAGNOSIS — I639 Cerebral infarction, unspecified: Principal | ICD-10-CM | POA: Diagnosis present

## 2014-03-16 DIAGNOSIS — Z66 Do not resuscitate: Secondary | ICD-10-CM | POA: Diagnosis present

## 2014-03-16 HISTORY — DX: Unspecified macular degeneration: H35.30

## 2014-03-16 LAB — CBC
HCT: 46.7 % — ABNORMAL HIGH (ref 36.0–46.0)
Hemoglobin: 15.5 g/dL — ABNORMAL HIGH (ref 12.0–15.0)
MCH: 31.3 pg (ref 26.0–34.0)
MCHC: 33.2 g/dL (ref 30.0–36.0)
MCV: 94.2 fL (ref 78.0–100.0)
Platelets: 224 10*3/uL (ref 150–400)
RBC: 4.96 MIL/uL (ref 3.87–5.11)
RDW: 12.5 % (ref 11.5–15.5)
WBC: 6.7 10*3/uL (ref 4.0–10.5)

## 2014-03-16 LAB — COMPREHENSIVE METABOLIC PANEL
ALT: 14 U/L (ref 0–35)
AST: 23 U/L (ref 0–37)
Albumin: 4.5 g/dL (ref 3.5–5.2)
Alkaline Phosphatase: 77 U/L (ref 39–117)
Anion gap: 8 (ref 5–15)
BUN: 17 mg/dL (ref 6–23)
CO2: 24 mmol/L (ref 19–32)
Calcium: 9.7 mg/dL (ref 8.4–10.5)
Chloride: 107 mmol/L (ref 96–112)
Creatinine, Ser: 1.05 mg/dL (ref 0.50–1.10)
GFR calc Af Amer: 53 mL/min — ABNORMAL LOW (ref >90)
GFR calc non Af Amer: 46 mL/min — ABNORMAL LOW (ref >90)
Glucose, Bld: 113 mg/dL — ABNORMAL HIGH (ref 70–99)
Potassium: 3.6 mmol/L (ref 3.5–5.1)
Sodium: 139 mmol/L (ref 135–145)
Total Bilirubin: 0.7 mg/dL (ref 0.3–1.2)
Total Protein: 7.4 g/dL (ref 6.0–8.3)

## 2014-03-16 LAB — URINALYSIS, ROUTINE W REFLEX MICROSCOPIC
Bilirubin Urine: NEGATIVE
Glucose, UA: NEGATIVE mg/dL
Hgb urine dipstick: NEGATIVE
Ketones, ur: NEGATIVE mg/dL
Leukocytes, UA: NEGATIVE
Nitrite: NEGATIVE
Protein, ur: NEGATIVE mg/dL
Specific Gravity, Urine: 1.023 (ref 1.005–1.030)
Urobilinogen, UA: 0.2 mg/dL (ref 0.0–1.0)
pH: 5 (ref 5.0–8.0)

## 2014-03-16 LAB — TROPONIN I: Troponin I: <0.03 ng/mL (ref <0.031)

## 2014-03-16 LAB — POC OCCULT BLOOD, ED: Fecal Occult Bld: NEGATIVE

## 2014-03-16 LAB — PROTIME-INR
INR: 2.08 — ABNORMAL HIGH (ref 0.00–1.49)
Prothrombin Time: 23.5 seconds — ABNORMAL HIGH (ref 11.6–15.2)

## 2014-03-16 MED ORDER — WARFARIN SODIUM 5 MG PO TABS
5.0000 mg | ORAL_TABLET | Freq: Once | ORAL | Status: AC
  Administered 2014-03-16: 5 mg via ORAL
  Filled 2014-03-16: qty 1

## 2014-03-16 MED ORDER — WARFARIN - PHYSICIAN DOSING INPATIENT: Freq: Every day | Status: DC

## 2014-03-16 NOTE — ED Notes (Signed)
Patient transported to CT 

## 2014-03-16 NOTE — ED Notes (Signed)
Family remains at bedside.

## 2014-03-16 NOTE — H&P (Signed)
PCP:  [name], MD at [facility]   Chief Complaint:  confusion  HPI: Laura Diaz is a 81 y.o. female   has a past medical history of Macular degeneration; Renal disorder; and Stroke.   Presented with  Patient has hx of Dementia at her baseline she used to drive. She has a care taker that comes twice a week.  2 weeks ago she had a mechanical fall and hit her head. She is on coumadin for remote hx of TIA 20 years ago. CT of the head showed no intracranial bleeding. 2 days ago she started to have sudden worsening confusion. She had a dramatic change with inability to follow directions and trying to use her purse as a shoe. Denies any fever, good PO intake, no cough. NO slurred speech no localized weakness. She has needed more help with walking but now back to baseline.  Hospitalist was called for admission for Delirium  Review of Systems:    Pertinent positives include:  confusion  Constitutional:  No weight loss, night sweats, Fevers, chills, fatigue, weight loss  HEENT:  No headaches, Difficulty swallowing,Tooth/dental problems,Sore throat,  No sneezing, itching, ear ache, nasal congestion, post nasal drip,  Cardio-vascular:  No chest pain, Orthopnea, PND, anasarca, dizziness, palpitations.no Bilateral lower extremity swelling  GI:  No heartburn, indigestion, abdominal pain, nausea, vomiting, diarrhea, change in bowel habits, loss of appetite, melena, blood in stool, hematemesis Resp:  no shortness of breath at rest. No dyspnea on exertion, No excess mucus, no productive cough, No non-productive cough, No coughing up of blood.No change in color of mucus.No wheezing. Skin:  no rash or lesions. No jaundice GU:  no dysuria, change in color of urine, no urgency or frequency. No straining to urinate.  No flank pain.  Musculoskeletal:  No joint pain or no joint swelling. No decreased range of motion. No back pain.  Psych:  No change in mood or affect. No depression or  anxiety. No memory loss.  Neuro: no localizing neurological complaints, no tingling, no weakness, no double vision, no gait abnormality, no slurred speech, no  Otherwise ROS are negative except for above, 10 systems were reviewed  Past Medical History: Past Medical History  Diagnosis Date  . Macular degeneration   . Renal disorder   . Stroke    History reviewed. No pertinent past surgical history.   Medications: Prior to Admission medications   Medication Sig Start Date End Date Taking? Authorizing Provider  LORazepam (ATIVAN) 1 MG tablet Take 1-1.5 mg by mouth 2 (two) times daily. 1.5mg  once a day and then take  at bedtime prn for anxiety/sleep   Yes Historical Provider, MD  traMADol (ULTRAM) 50 MG tablet Take 50 mg by mouth 2 (two) times daily.   Yes Historical Provider, MD  warfarin (COUMADIN) 5 MG tablet Take 5 mg by mouth daily.   Yes Historical Provider, MD    Allergies:  No Known Allergies  Social History:  Ambulatory  Walker but not using Lives at home alone,        reports that she has never smoked. She does not have any smokeless tobacco history on file. She reports that she does not drink alcohol or use illicit drugs.    Family History: family history includes Breast cancer in her sister; Hypertension in her son; Parkinson's disease in her sister.    Physical Exam: Patient Vitals for the past 24 hrs:  BP Temp Temp src Pulse Resp SpO2  03/16/14 1947 154/74 mmHg  98.7 F (37.1 C) Oral 65 18 97 %  03/16/14 1844 - - - - 12 -  03/16/14 1711 171/89 mmHg 98 F (36.7 C) Oral (!) 57 16 99 %    1. General:  in No Acute distress 2. Psychological: Alert but not Oriented 3. Head/ENT:   Moist  Mucous Membranes                          Head Non traumatic, neck supple                          Normal  Dentition 4. SKIN: decreased Skin turgor,  Skin clean Dry and intact no rash 5. Heart: Regular rate and rhythm no Murmur, Rub or gallop 6. Lungs: Clear to  auscultation bilaterally, no wheezes or crackles   7. Abdomen: Soft, non-tender, Non distended 8. Lower extremities: no clubbing, cyanosis, or edema 9. Neurologically rank 5 out of 5 in all 4 extremities cranial nerves II through XII intact. Had family no change in her walking ability patient ambulated with minimal assistance  10. MSK: Normal range of motion  body mass index is unknown because there is no height or weight on file.   Labs on Admission:   Results for orders placed or performed during the hospital encounter of 03/16/14 (from the past 24 hour(s))  Comprehensive metabolic panel     Status: Abnormal   Collection Time: 03/16/14  5:47 PM  Result Value Ref Range   Sodium 139 135 - 145 mmol/L   Potassium 3.6 3.5 - 5.1 mmol/L   Chloride 107 96 - 112 mmol/L   CO2 24 19 - 32 mmol/L   Glucose, Bld 113 (H) 70 - 99 mg/dL   BUN 17 6 - 23 mg/dL   Creatinine, Ser 1.61 0.50 - 1.10 mg/dL   Calcium 9.7 8.4 - 09.6 mg/dL   Total Protein 7.4 6.0 - 8.3 g/dL   Albumin 4.5 3.5 - 5.2 g/dL   AST 23 0 - 37 U/L   ALT 14 0 - 35 U/L   Alkaline Phosphatase 77 39 - 117 U/L   Total Bilirubin 0.7 0.3 - 1.2 mg/dL   GFR calc non Af Amer 46 (L) >90 mL/min   GFR calc Af Amer 53 (L) >90 mL/min   Anion gap 8 5 - 15  CBC     Status: Abnormal   Collection Time: 03/16/14  5:47 PM  Result Value Ref Range   WBC 6.7 4.0 - 10.5 K/uL   RBC 4.96 3.87 - 5.11 MIL/uL   Hemoglobin 15.5 (H) 12.0 - 15.0 g/dL   HCT 04.5 (H) 40.9 - 81.1 %   MCV 94.2 78.0 - 100.0 fL   MCH 31.3 26.0 - 34.0 pg   MCHC 33.2 30.0 - 36.0 g/dL   RDW 91.4 78.2 - 95.6 %   Platelets 224 150 - 400 K/uL  Protime-INR (if patient is on an anticoagulant)     Status: Abnormal   Collection Time: 03/16/14  5:47 PM  Result Value Ref Range   Prothrombin Time 23.5 (H) 11.6 - 15.2 seconds   INR 2.08 (H) 0.00 - 1.49  Troponin I     Status: None   Collection Time: 03/16/14  5:50 PM  Result Value Ref Range   Troponin I <0.03 <0.031 ng/mL  POC  occult blood, ED Provider will collect     Status: None   Collection Time:  03/16/14  6:10 PM  Result Value Ref Range   Fecal Occult Bld NEGATIVE NEGATIVE  Urinalysis, Routine w reflex microscopic     Status: None   Collection Time: 03/16/14  6:43 PM  Result Value Ref Range   Color, Urine YELLOW YELLOW   APPearance CLEAR CLEAR   Specific Gravity, Urine 1.023 1.005 - 1.030   pH 5.0 5.0 - 8.0   Glucose, UA NEGATIVE NEGATIVE mg/dL   Hgb urine dipstick NEGATIVE NEGATIVE   Bilirubin Urine NEGATIVE NEGATIVE   Ketones, ur NEGATIVE NEGATIVE mg/dL   Protein, ur NEGATIVE NEGATIVE mg/dL   Urobilinogen, UA 0.2 0.0 - 1.0 mg/dL   Nitrite NEGATIVE NEGATIVE   Leukocytes, UA NEGATIVE NEGATIVE    UA no evidence UTI  No results found for: HGBA1C  CrCl cannot be calculated (Unknown ideal weight.).  BNP (last 3 results) No results for input(s): PROBNP in the last 8760 hours.  Other results:  I have pearsonaly reviewed this: ECG REPORT  Rate: 67  Rhythm: SR with prolonged PR ST&T Change: no significant ischemia   There were no vitals filed for this visit.   Cultures: No results found for: SDES, SPECREQUEST, CULT, REPTSTATUS   Radiological Exams on Admission: Ct Head Wo Contrast  03/16/2014   CLINICAL DATA:  Increased confusion for past few days per family, progressively worse today and this afternoon, disorientation, fell several weeks ago, history of stroke  EXAM: CT HEAD WITHOUT CONTRAST  TECHNIQUE: Contiguous axial images were obtained from the base of the skull through the vertex without intravenous contrast.  COMPARISON:  None  FINDINGS: Generalized atrophy.  Normal ventricular morphology.  No midline shift or mass effect.  Small vessel chronic ischemic changes of deep cerebral white matter.  No intracranial hemorrhage, mass lesion, or acute infarction.  Visualized paranasal sinuses and mastoid air cells clear.  Bones unremarkable.  IMPRESSION: Atrophy with small vessel chronic ischemic  changes of deep cerebral white matter.  No acute intracranial abnormalities.   Electronically Signed   By: Ulyses SouthwardMark  Boles M.D.   On: 03/16/2014 19:47    Chart has been reviewed  Assessment/Plan  81 year old female with history of dementia, on Coumadin for remote history of CVA although not documented atrial fibrillation presents with worsening confusion acute delirium  Present on Admission:  . Delirium/ Encephalopathy acute - this has been going on for the past 2 days. CT scan of the head unremarkable. Will evaluate MRI although nonfocal neurological exam. No evidence of UTI, patient is still pleasantly confused. This part of her oral workup will order chest x-ray. No chest pain no EKG changes troponin 1 negative . Dementia - chronic patient's family would like to avoid placement at this time but willing to move in with the patient and provide care for her.   xof CVA on coumadin - given recent fall, and advanced age if no evidence of CVA or a.fib on tele patient would benefit from discontinuation given increased risk from falls.     Prophylaxis: on coumadin, Protonix  CODE STATUS:    DNR/DNI  Other plan as per orders.  I have spent a total of 55 min on this admission  Joslyne Marshburn 03/16/2014, 10:47 PM  [facility]  Pager [phone]   after 2 AM please page floor coverage PA If 7AM-7PM, please contact the day team taking care of the patient  [email or web address]  Password [email or web address]

## 2014-03-16 NOTE — Progress Notes (Signed)
EDCM spoke to patient and her family at bedside.  Patient is extremely hard of hearing.  Patient's daughter answered most of EDCM's questions.  Patient confirms she lives by herself.  Patient's daughter reports patient has a cretaker who comes to stay with the patient two times a week and stay three hours a day from a private duty agency [facility].  Patient's daughter reports the patient can shower herself, "we just need someone there to encourage her to shower."  Aides from the agency assist with  Light house work, Pharmacologistlaundry, and changes sheets.  Patient does not have home health services at this time.  Patient's daughter reports she lives in [place] but patient's son is "in and out" the patient's house frequently who lives in [place].  Patient's daughter reports that patient has had [facility] in the past in November and was declined due to patient was driving and was not considered homebound.  Patient's daughter reports that she has taken the patient's keys away from her as she has declined rapidly in the last three months.  Patient's daughter interested in receiving home health services for patient again.  [facility] felt [facility]?  [facility].  EDCM provided patient;s daughter with list if [place] health [place] [facility] which [facility]'s [facility] has [facility].  EDCM will check back with patient and family regarding home health services.  No further EDCM needs at this time.

## 2014-03-16 NOTE — ED Notes (Signed)
Family reports pt has had increased confusion for past few days that has gotten progressively worse today and this afternoon. Pt disoriented to time, but oriented to self, location and situation. Family states pt has been doing strange things at home such as putting her shoes on the wrong feet and putting her feet in her purse. Family states pt normally mildly confused, but nothing like recently. Family reports pt had a fall several weeks ago.

## 2014-03-16 NOTE — ED Notes (Signed)
Jacuowitz, MD at bedside talking with pt and family member.

## 2014-03-16 NOTE — Progress Notes (Signed)
Patient to be admitted for further evaluation of delerium per EDP.  EDCM went back to patient's room to speak to patient's daughter regarding home health services.  EDCM informed patient's daughter that case management will follow.  [place]ar[place] converstion with patient's daughter, [facility] informed her that patient's pcp can also arrange home health services.  Patient's daughter reports the patient has done that before.  No further EDCM needs at this time

## 2014-03-16 NOTE — ED Provider Notes (Signed)
CSN: [id number]     Arrival date & time 03/16/14  1657 History   First MD Initiated Contact with Patient 03/16/14 1746     Chief Complaint  Patient presents with  . Altered Mental Status     (Consider location/radiation/quality/duration/timing/severity/associated sxs/prior Treatment) HPI Level V caveat dementia. History is obtained from patient's daughter. Patient reportedly fell presently 2 weeks ago. Fall was unwitnessed she struck her head daughter reports that she had "a black eye" patient became acutely more confused yesterday, putting her feet into her purse when it came time to get dressed. She was asking where her glasses were when they were on her head. She did not recognize her daughter earlier today. Patient presently denies any complaint no treatment prior to coming here Past Medical History  Diagnosis Date  . Macular degeneration   . Renal disorder   . Stroke    History reviewed. No pertinent past surgical history. History reviewed. No pertinent family history. past medical history dementia  History  Substance Use Topics  . Smoking status: Never Smoker   . Smokeless tobacco: Not on file  . Alcohol Use: No   OB History    No data available     Review of Systems  Unable to perform ROS  unable to perform review of systems, altered mental status    Allergies  Review of patient's allergies indicates no known allergies.  Home Medications   Prior to Admission medications   Not on File   BP 171/89 mmHg  Pulse 57  Temp(Src) 98 F (36.7 C) (Oral)  Resp 16  SpO2 99% Physical Exam  Constitutional: She appears well-developed and well-nourished.  HENT:  Head: Normocephalic and atraumatic.  Eyes: Conjunctivae are normal. Pupils are equal, round, and reactive to light.  Neck: Neck supple. No tracheal deviation present. No thyromegaly present.  Cardiovascular: Normal rate and regular rhythm.   No murmur heard. Pulmonary/Chest: Effort normal and breath sounds  normal.  Abdominal: Soft. Bowel sounds are normal. She exhibits no distension. There is no tenderness.  Genitourinary: Guaiac negative stool.  Musculoskeletal: Normal range of motion. She exhibits no edema or tenderness.  Neurological: She is alert. She displays normal reflexes. Coordination normal.  Oriented to name and hospital follow simple commands cranial nerves II through XII grossly intact motor strength 5 over 5 overall  Skin: Skin is warm and dry. No rash noted.  Psychiatric: She has a normal mood and affect.  Nursing note and vitals reviewed.   ED Course  Procedures (including critical care time) Labs Review Labs Reviewed  COMPREHENSIVE METABOLIC PANEL  CBC  PROTIME-INR    Imaging Review No results found.   EKG Interpretation   Date/Time:  Friday March 16 2014 18:33:32 EST Ventricular Rate:  67 PR Interval:  256 QRS Duration: 93 QT Interval:  405 QTC Calculation: 427 R Axis:   45 Text Interpretation:  Sinus rhythm Prolonged PR interval Low voltage,  precordial leads Minimal ST depression, diffuse leads Baseline wander in  lead(s) II V4 V6 No significant change since last tracing Confirmed by  Ethelda Chick  MD, Karma Ansley [phone]) on 03/16/2014 6:48:50 PM     Results for orders placed or performed during the hospital encounter of 03/16/14  Comprehensive metabolic panel  Result Value Ref Range   Sodium 139 135 - 145 mmol/L   Potassium 3.6 3.5 - 5.1 mmol/L   Chloride 107 96 - 112 mmol/L   CO2 24 19 - 32 mmol/L   Glucose, Bld 113 (H) 70 -  99 mg/dL   BUN 17 6 - 23 mg/dL   Creatinine, Ser 1.611.05 0.50 - 1.10 mg/dL   Calcium 9.7 8.4 - 09.610.5 mg/dL   Total Protein 7.4 6.0 - 8.3 g/dL   Albumin 4.5 3.5 - 5.2 g/dL   AST 23 0 - 37 U/L   ALT 14 0 - 35 U/L   Alkaline Phosphatase 77 39 - 117 U/L   Total Bilirubin 0.7 0.3 - 1.2 mg/dL   GFR calc non Af Amer 46 (L) >90 mL/min   GFR calc Af Amer 53 (L) >90 mL/min   Anion gap 8 5 - 15  CBC  Result Value Ref Range   WBC 6.7 4.0 -  10.5 K/uL   RBC 4.96 3.87 - 5.11 MIL/uL   Hemoglobin 15.5 (H) 12.0 - 15.0 g/dL   HCT 04.546.7 (H) 40.936.0 - 81.146.0 %   MCV 94.2 78.0 - 100.0 fL   MCH 31.3 26.0 - 34.0 pg   MCHC 33.2 30.0 - 36.0 g/dL   RDW 91.412.5 78.211.5 - 95.615.5 %   Platelets 224 150 - 400 K/uL  Protime-INR (if patient is on an anticoagulant)  Result Value Ref Range   Prothrombin Time 23.5 (H) 11.6 - 15.2 seconds   INR 2.08 (H) 0.00 - 1.49  Urinalysis, Routine w reflex microscopic  Result Value Ref Range   Color, Urine YELLOW YELLOW   APPearance CLEAR CLEAR   Specific Gravity, Urine 1.023 1.005 - 1.030   pH 5.0 5.0 - 8.0   Glucose, UA NEGATIVE NEGATIVE mg/dL   Hgb urine dipstick NEGATIVE NEGATIVE   Bilirubin Urine NEGATIVE NEGATIVE   Ketones, ur NEGATIVE NEGATIVE mg/dL   Protein, ur NEGATIVE NEGATIVE mg/dL   Urobilinogen, UA 0.2 0.0 - 1.0 mg/dL   Nitrite NEGATIVE NEGATIVE   Leukocytes, UA NEGATIVE NEGATIVE  Troponin I  Result Value Ref Range   Troponin I <0.03 <0.031 ng/mL  POC occult blood, ED Provider will collect  Result Value Ref Range   Fecal Occult Bld NEGATIVE NEGATIVE   Ct Head Wo Contrast  03/16/2014   CLINICAL DATA:  Increased confusion for past few days per family, progressively worse today and this afternoon, disorientation, fell several weeks ago, history of stroke  EXAM: CT HEAD WITHOUT CONTRAST  TECHNIQUE: Contiguous axial images were obtained from the base of the skull through the vertex without intravenous contrast.  COMPARISON:  None  FINDINGS: Generalized atrophy.  Normal ventricular morphology.  No midline shift or mass effect.  Small vessel chronic ischemic changes of deep cerebral white matter.  No intracranial hemorrhage, mass lesion, or acute infarction.  Visualized paranasal sinuses and mastoid air cells clear.  Bones unremarkable.  IMPRESSION: Atrophy with small vessel chronic ischemic changes of deep cerebral white matter.  No acute intracranial abnormalities.   Electronically Signed   By: Ulyses SouthwardMark  Boles  M.D.   On: 03/16/2014 19:47    MDM  Patient likely with acute delirium on top of chronic dementia with sudden onset worsening of mental status. I spoke with Dr.Doutova who arranged for inpatient stay Diagnoses acute delirium Final diagnoses:  None        Doug SouSam Zulay Corrie, MD 03/17/14 [phone]

## 2014-03-17 ENCOUNTER — Inpatient Hospital Stay (HOSPITAL_COMMUNITY): Payer: Medicare Other

## 2014-03-17 DIAGNOSIS — Z8673 Personal history of transient ischemic attack (TIA), and cerebral infarction without residual deficits: Secondary | ICD-10-CM | POA: Diagnosis present

## 2014-03-17 DIAGNOSIS — G9340 Encephalopathy, unspecified: Secondary | ICD-10-CM

## 2014-03-17 DIAGNOSIS — I639 Cerebral infarction, unspecified: Principal | ICD-10-CM

## 2014-03-17 DIAGNOSIS — F0390 Unspecified dementia without behavioral disturbance: Secondary | ICD-10-CM

## 2014-03-17 LAB — TSH: TSH: 3.312 u[IU]/mL (ref 0.350–4.500)

## 2014-03-17 LAB — COMPREHENSIVE METABOLIC PANEL
ALT: 15 U/L (ref 0–35)
AST: 22 U/L (ref 0–37)
Albumin: 3.9 g/dL (ref 3.5–5.2)
Alkaline Phosphatase: 72 U/L (ref 39–117)
Anion gap: 7 (ref 5–15)
BUN: 15 mg/dL (ref 6–23)
CO2: 25 mmol/L (ref 19–32)
Calcium: 9.1 mg/dL (ref 8.4–10.5)
Chloride: 107 mmol/L (ref 96–112)
Creatinine, Ser: 0.96 mg/dL (ref 0.50–1.10)
GFR calc Af Amer: 59 mL/min — ABNORMAL LOW (ref >90)
GFR calc non Af Amer: 51 mL/min — ABNORMAL LOW (ref >90)
Glucose, Bld: 106 mg/dL — ABNORMAL HIGH (ref 70–99)
Potassium: 3.6 mmol/L (ref 3.5–5.1)
Sodium: 139 mmol/L (ref 135–145)
Total Bilirubin: 1 mg/dL (ref 0.3–1.2)
Total Protein: 6.7 g/dL (ref 6.0–8.3)

## 2014-03-17 LAB — CBC
HCT: 44.4 % (ref 36.0–46.0)
Hemoglobin: 14.9 g/dL (ref 12.0–15.0)
MCH: 31.9 pg (ref 26.0–34.0)
MCHC: 33.6 g/dL (ref 30.0–36.0)
MCV: 95.1 fL (ref 78.0–100.0)
Platelets: 211 10*3/uL (ref 150–400)
RBC: 4.67 MIL/uL (ref 3.87–5.11)
RDW: 12.7 % (ref 11.5–15.5)
WBC: 5.2 10*3/uL (ref 4.0–10.5)

## 2014-03-17 LAB — PROTIME-INR
INR: 2.11 — ABNORMAL HIGH (ref 0.00–1.49)
Prothrombin Time: 23.8 seconds — ABNORMAL HIGH (ref 11.6–15.2)

## 2014-03-17 LAB — PHOSPHORUS: Phosphorus: 4.1 mg/dL (ref 2.3–4.6)

## 2014-03-17 LAB — VITAMIN B12: Vitamin B-12: 413 pg/mL (ref 211–911)

## 2014-03-17 LAB — TROPONIN I: Troponin I: <0.03 ng/mL (ref <0.031)

## 2014-03-17 LAB — MAGNESIUM: Magnesium: 2.2 mg/dL (ref 1.5–2.5)

## 2014-03-17 MED ORDER — ONDANSETRON HCL 4 MG PO TABS: 4.0000 mg | ORAL_TABLET | Freq: Four times a day (QID) | ORAL | Status: DC | PRN

## 2014-03-17 MED ORDER — STROKE: EARLY STAGES OF RECOVERY BOOK
Freq: Once | Status: AC
  Administered 2014-03-17: 21:00:00

## 2014-03-17 MED ORDER — ONDANSETRON HCL 4 MG/2ML IJ SOLN: 4.0000 mg | Freq: Four times a day (QID) | INTRAMUSCULAR | Status: DC | PRN

## 2014-03-17 MED ORDER — SODIUM CHLORIDE 0.9 % IJ SOLN
3.0000 mL | Freq: Two times a day (BID) | INTRAMUSCULAR | Status: DC
  Administered 2014-03-17 – 2014-03-18 (×3): 3 mL via INTRAVENOUS

## 2014-03-17 MED ORDER — ACETAMINOPHEN 650 MG RE SUPP: 650.0000 mg | Freq: Four times a day (QID) | RECTAL | Status: DC | PRN

## 2014-03-17 MED ORDER — WARFARIN - PHARMACIST DOSING INPATIENT
Freq: Every day | Status: DC
  Administered 2014-03-17 – 2014-03-18 (×2)

## 2014-03-17 MED ORDER — ACETAMINOPHEN 325 MG PO TABS: 650.0000 mg | ORAL_TABLET | Freq: Four times a day (QID) | ORAL | Status: DC | PRN

## 2014-03-17 MED ORDER — SODIUM CHLORIDE 0.9 % IV SOLN
INTRAVENOUS | Status: AC
  Administered 2014-03-17: 01:00:00 via INTRAVENOUS

## 2014-03-17 MED ORDER — LORAZEPAM 1 MG PO TABS
1.0000 mg | ORAL_TABLET | Freq: Two times a day (BID) | ORAL | Status: DC | PRN
Start: 2014-03-17 — End: 2014-03-19
  Administered 2014-03-18: 1 mg via ORAL
  Filled 2014-03-17: qty 1

## 2014-03-17 MED ORDER — WARFARIN SODIUM 5 MG PO TABS
5.0000 mg | ORAL_TABLET | Freq: Once | ORAL | Status: AC
  Administered 2014-03-17: 5 mg via ORAL
  Filled 2014-03-17 (×2): qty 1

## 2014-03-17 MED ORDER — POTASSIUM CHLORIDE CRYS ER 20 MEQ PO TBCR
40.0000 meq | EXTENDED_RELEASE_TABLET | Freq: Once | ORAL | Status: AC
  Administered 2014-03-17: 40 meq via ORAL
  Filled 2014-03-17: qty 2

## 2014-03-17 MED ORDER — SODIUM CHLORIDE 0.9 % IJ SOLN
3.0000 mL | Freq: Two times a day (BID) | INTRAMUSCULAR | Status: DC
  Administered 2014-03-18: 3 mL via INTRAVENOUS

## 2014-03-17 MED ORDER — TRAMADOL HCL 50 MG PO TABS: 50.0000 mg | ORAL_TABLET | Freq: Four times a day (QID) | ORAL | Status: DC | PRN

## 2014-03-17 MED ORDER — DOCUSATE SODIUM 100 MG PO CAPS
100.0000 mg | ORAL_CAPSULE | Freq: Two times a day (BID) | ORAL | Status: DC
  Administered 2014-03-17 – 2014-03-19 (×5): 100 mg via ORAL
  Filled 2014-03-17 (×5): qty 1

## 2014-03-17 NOTE — Progress Notes (Signed)
Pt with 2.46 pause and nonsustaining HR 30s. MD notified. Will continue to monitor.

## 2014-03-17 NOTE — Progress Notes (Signed)
ANTICOAGULATION CONSULT NOTE - Initial Consult  Pharmacy Consult for warfarin Indication: hx of CVA  No Known Allergies  Patient Measurements: Height: 5\' 3"  (160 cm) Weight: 183 lb 13.8 oz (83.4 kg) IBW/kg (Calculated) : 52.4 Heparin Dosing Weight:   Vital Signs: Temp: 97.9 F (36.6 C) (02/20 0128) Temp Source: Oral (02/20 0128) BP: 155/70 mmHg (02/20 0128) Pulse Rate: 51 (02/20 0128)  Labs:  Recent Labs  03/16/14 1747 03/16/14 1750 03/17/14 0502  HGB 15.5*  --  14.9  HCT 46.7*  --  44.4  PLT 224  --  211  LABPROT 23.5*  --  23.8*  INR 2.08*  --  2.11*  CREATININE 1.05  --   --   TROPONINI  --  <0.03  --     Estimated Creatinine Clearance: 37.2 mL/min (by C-G formula based on Cr of 1.05).   Medical History: Past Medical History  Diagnosis Date  . Macular degeneration   . Renal disorder   . Stroke     Medications:  Prescriptions prior to admission  Medication Sig Dispense Refill Last Dose  . LORazepam (ATIVAN) 1 MG tablet Take 1-1.5 mg by mouth 2 (two) times daily. 1.5mg  once a day and then take 1mg  at bedtime prn for anxiety/sleep   03/15/2014 at Unknown time  . traMADol (ULTRAM) 50 MG tablet Take 50 mg by mouth 2 (two) times daily.   03/15/2014 at Unknown time  . warfarin (COUMADIN) 5 MG tablet Take 5 mg by mouth daily.   03/15/2014 at Unknown time   Scheduled:  . docusate sodium  100 mg Oral BID  . sodium chloride  3 mL Intravenous Q12H  . Warfarin - Pharmacist Dosing Inpatient   Does not apply q1800    Assessment: Patient has hx of Dementia at her baseline she used to drive. Two weeks ago she had a mechanical fall and hit her head. She is on coumadin for remote hx of TIA 20 years ago. CT of the head showed no intracranial bleeding. Pharmacy to dose warfarin. INR 2.08 on admit.  Goal of Therapy:  INR 2-3    Plan:  Daily INR Warfarin 5mg  po x1 at 1800 on 2/20  Darlina GuysGrimsley Jr, Jacquenette ShoneJulian Crowford 03/17/2014,5:41 AM

## 2014-03-17 NOTE — Progress Notes (Signed)
[facility] is [facility]. Admission vitals signs are stable

## 2014-03-17 NOTE — Progress Notes (Signed)
[facility] PROGRESS NOTE  Laura Diaz MVH:[id number] DOB: 16-Jul-1922 DOA: 03/16/2014 PCP: Kristie CowmanSCHOOLER, [name], MD  Assessment/Plan: 1. Acute CVA 1. Acute 5mm CVA involving R post periventricular white matter 2. Likely source of below delerium 3. Have discussed findings with Neurology, who [facility] by Neuro/Stroke Team 4. Will order 2d echo and B carotid dopplers 5. Will order PT/OT/SLP 6. Continue coumadin for now per Neurology input 2. Delerium 1. Suspect secondary to above acute CVA 2. Stable at present 3. Dementia 1. Stable 4. Macular degeneration 1. Stable currently 5. Chronic anticoagulation 1. No signs of afib 2. Cont coumadin for now per above 3. Cont to monitor INR closely, dosing per pharmacy 6. Sinus bradycardia 1. HR in the 50's on EKG 2. Pt evaluated and remains asymptomatic 3. Will check serial trop 4. F/u 2d echo per above 5. Cont on monitor for now 6. No av nodal agents on med list 7. DVTprophylaxis 1. On therapeutic coumadin  Code Status: DNR Family Communication: Pt in room, son at bedside (indicate person spoken with, relationship, and if by phone, the number) [facility]   Consultants:  Neurology  Procedures:  MRI brain  Antibiotics:  none (indicate start date, and stop date if known)  HPI/Subjective: Pt is without complaints. Denies focal weakness  Objective: Filed Vitals:   03/16/14 2311 03/17/14 0128 03/17/14 0611 03/17/14 0900  BP: 163/97 155/70 146/61 134/56  Pulse: 76 51 55 47  Temp:  97.9 F (36.6 C) 98 F (36.7 C) 98 F (36.7 C)  TempSrc:  Oral Oral Oral  Resp: 18 18 18 18   Height:  5\' 3"  (1.6 m)    Weight:  83.4 kg (183 lb 13.8 oz)    SpO2: 99% 97% 100% 99%    Intake/Output Summary (Last 24 hours) at 03/17/14 1236 Last data filed at 03/17/14 0725  Gross per 24 hour  Intake  487.5 ml  Output      0 ml  Net  487.5 ml   Filed Weights   03/17/14  0128  Weight: 83.4 kg (183 lb 13.8 oz)    Exam:   General:  Awake, eating, in nad  Cardiovascular: regular, bradycardic, s1, s2  Respiratory: normal resp effrot, no wheezing  Abdomen: soft,nondistended  Musculoskeletal: perfused, no clubbing   Data Reviewed: Basic Metabolic Panel:  Recent Labs Lab 03/16/14 1747 03/17/14 0502  NA 139 139  Diaz 3.6 3.6  CL 107 107  CO2 24 25  GLUCOSE 113* 106*  BUN 17 15  CREATININE 1.05 0.96  CALCIUM 9.7 9.1  MG  --  2.2  PHOS  --  4.1   Liver Function Tests:  Recent Labs Lab 03/16/14 1747 03/17/14 0502  AST 23 22  ALT 14 15  ALKPHOS 77 72  BILITOT 0.7 1.0  PROT 7.4 6.7  ALBUMIN 4.5 3.9   No results for input(s): LIPASE, AMYLASE in the last 168 hours. No results for input(s): AMMONIA in the last 168 hours. CBC:  Recent Labs Lab 03/16/14 1747 03/17/14 0502  WBC 6.7 5.2  HGB 15.5* 14.9  HCT 46.7* 44.4  MCV 94.2 95.1  PLT 224 211   Cardiac Enzymes:  Recent Labs Lab 03/16/14 1750  TROPONINI <0.03   BNP (last 3 results) No results for input(s): BNP in the last 8760 hours.  ProBNP (last 3 results) No results for input(s): PROBNP in the last 8760 hours.  CBG: No results for input(s): GLUCAP in the  last 168 hours.  No results found for this or any previous visit (from the past 240 hour(s)).   Studies: X-ray Chest Pa And Lateral  03/17/2014   CLINICAL DATA:  Increasing confusion over the past few days.  EXAM: CHEST  2 VIEW  COMPARISON:  01/07/2006  FINDINGS: A single AP portable view of the chest demonstrates no focal airspace consolidation or alveolar edema. The lungs are grossly clear. There is no large effusion or pneumothorax. There is unchanged cardiomegaly. Cardiac and mediastinal contours are otherwise unremarkable.  IMPRESSION: No active cardiopulmonary disease.   Electronically Signed   By: Ellery Plunk M.D.   On: 03/17/2014 00:43   Ct Head Wo Contrast  03/16/2014   CLINICAL DATA:  Increased  confusion for past few days per family, progressively worse today and this afternoon, disorientation, fell several weeks ago, history of stroke  EXAM: CT HEAD WITHOUT CONTRAST  TECHNIQUE: Contiguous axial images were obtained from the base of the skull through the vertex without intravenous contrast.  COMPARISON:  None  FINDINGS: Generalized atrophy.  Normal ventricular morphology.  No midline shift or mass effect.  Small vessel chronic ischemic changes of deep cerebral white matter.  No intracranial hemorrhage, mass lesion, or acute infarction.  Visualized paranasal sinuses and mastoid air cells clear.  Bones unremarkable.  IMPRESSION: Atrophy with small vessel chronic ischemic changes of deep cerebral white matter.  No acute intracranial abnormalities.   Electronically Signed   By: Ulyses Southward M.D.   On: 03/16/2014 19:47   Mr Brain Wo Contrast  03/17/2014   CLINICAL DATA:  Encephalopathy.  Mental status change  EXAM: MRI HEAD WITHOUT CONTRAST  TECHNIQUE: Multiplanar, multiecho pulse sequences of the brain and surrounding structures were obtained without intravenous contrast.  COMPARISON:  CT 03/16/2014  FINDINGS: Mild to moderate atrophy. Mild ventricular enlargement consistent with the degree of atrophy. Craniocervical junction normal. Pituitary not enlarged.  5 mm acute infarct in the right posterior temporal periventricular white matter. No other acute infarct  Moderate chronic microvascular ischemic change throughout the white matter and pons.  Negative for hemorrhage  Negative for mass or edema.  IMPRESSION: 5 mm acute infarct right posterior periventricular white matter .  Atrophy and moderate chronic microvascular ischemia.   Electronically Signed   By: Marlan Palau M.D.   On: 03/17/2014 10:23    Scheduled Meds: . docusate sodium  100 mg Oral BID  . potassium chloride  40 mEq Oral Once  . sodium chloride  3 mL Intravenous Q12H  . warfarin  5 mg Oral ONCE-1800  . Warfarin - Pharmacist Dosing  Inpatient   Does not apply q1800   Continuous Infusions:   Active Problems:   Delirium   Dementia   Encephalopathy acute   Laura Diaz  [facility] Pager [phone]. If 7PM-7AM, please contact night-coverage at [email or web address], password [facility] 03/17/2014, 12:36 PM  LOS: 1 day

## 2014-03-17 NOTE — Consult Note (Signed)
Neurology Consultation Reason for Consult: Stroke Referring Physician: Johnna Acostahiu, S  CC: Stroke  History is obtained from: Patient, medical record  HPI: Laura Diaz is a 81 y.o. female with a history of dementia who lives at home and was seen to be confused beginning several days before admission. It was noted that she was having a difficult time following directions, and trying to use her purse as a shoe. It was felt that she was likely delirious and she was admitted for an altered mental status [facility]up [facility].  As part of that workup she received an MRI which does show a right parietal infarct which is relatively small.   LKW: 2/17 tpa given?: no, outside of window    ROS: A 14 point ROS was performed and is negative except as noted in the HPI.   Past Medical History  Diagnosis Date  . Macular degeneration   . Renal disorder   . Stroke     Family History: No history of strokes  Social History: Tob: Quit 30 years ago  Exam: Current vital signs: BP 147/79 mmHg  Pulse 68  Temp(Src) 97.9 F (36.6 C) (Oral)  Resp 18  Ht 5\' 3"  (1.6 m)  Wt 83.4 kg (183 lb 13.8 oz)  BMI 32.58 kg/m2  SpO2 96% Vital signs in last 24 hours: Temp:  [97.4 F (36.3 C)-98.8 F (37.1 C)] 97.9 F (36.6 C) (02/20 1819) Pulse Rate:  [47-76] 68 (02/20 1819) Resp:  [18-20] 18 (02/20 1819) BP: (114-163)/(46-97) 147/79 mmHg (02/20 1819) SpO2:  [96 %-100 %] 96 % (02/20 1819) Weight:  [83.4 kg (183 lb 13.8 oz)] 83.4 kg (183 lb 13.8 oz) (02/20 0128)  Physical Exam  Constitutional: Appears elderly, well-nourished.  Psych: Affect appropriate to situation Eyes: No scleral injection HENT: No OP obstrucion Head: Normocephalic.  Cardiovascular: Normal rate and regular rhythm.  Respiratory: Effort normal and breath sounds normal to anterior ascultation GI: Soft.  No distension. There is no tenderness.  Skin: WDI  Neuro: Mental Status: Patient is awake, alert, oriented to person, place,  month, she gives the year as "1916" No signs of aphasia or neglect World backwards is "d-l-o-w" she gives the answer to how many quarters are in $2.75 as 6  Cranial Nerves: II: Visual Fields are full. Pupils are equal, round, and reactive to light.   III,IV, VI: EOMI without ptosis or diploplia.  V: Facial sensation is symmetric to temperature VII: Facial movement is symmetric.  VIII: hearing is intact to voice X: Uvula elevates symmetrically XI: Shoulder shrug is symmetric. XII: tongue is midline without atrophy or fasciculations.  Motor: Tone is normal. Bulk is normal. 5/5 strength was present in all four extremities.  Sensory: Sensation is symmetric to light touch and temperature in the arms and legs. Deep Tendon Reflexes: 2+ and symmetric in the biceps and patellae.  Cerebellar: FNF intact bilaterally         I have reviewed labs in epic and the results pertinent to this consultation are: TSH normal   I have reviewed the images obtained: MRI brain-small subcortical white matter infarct in the right parietal region   Impression: 81 year old female with altered mental status. The deficits described, specifically following directions, can be seen with parietal infarcts. Hers is very small but coupled with her dementia it may be responsible for her current confusion. It is very small, and I think that would be relatively low likelihood of bleeding and therefore would not recommend anticoagulation reversal.  She may benefit from changing to a new anticoagulant.  Recommendations: 1. HgbA1c, fasting lipid panel 2. MRI, MRA  of the brain without contrast 3. Frequent neuro checks 4. Echocardiogram 5. Carotid dopplers 6. Prophylactic continue Coumadin, consider NOAC 7. Risk factor modification 8. Telemetry monitoring 9. PT consult, OT consult, Speech consult   Ritta Slot, MD [facility] [phone]  If 7pm- 7am, please page neurology on call as  listed in AMION. [phone]

## 2014-03-18 DIAGNOSIS — I6789 Other cerebrovascular disease: Secondary | ICD-10-CM

## 2014-03-18 DIAGNOSIS — R410 Disorientation, unspecified: Secondary | ICD-10-CM | POA: Insufficient documentation

## 2014-03-18 DIAGNOSIS — E785 Hyperlipidemia, unspecified: Secondary | ICD-10-CM

## 2014-03-18 LAB — CBC
HCT: 44 % (ref 36.0–46.0)
Hemoglobin: 14.6 g/dL (ref 12.0–15.0)
MCH: 31.5 pg (ref 26.0–34.0)
MCHC: 33.2 g/dL (ref 30.0–36.0)
MCV: 95 fL (ref 78.0–100.0)
Platelets: 198 10*3/uL (ref 150–400)
RBC: 4.63 MIL/uL (ref 3.87–5.11)
RDW: 12.7 % (ref 11.5–15.5)
WBC: 4.3 10*3/uL (ref 4.0–10.5)

## 2014-03-18 LAB — LIPID PANEL
Cholesterol: 209 mg/dL — ABNORMAL HIGH (ref 0–200)
HDL: 36 mg/dL — ABNORMAL LOW (ref >39)
LDL Cholesterol: 149 mg/dL — ABNORMAL HIGH (ref 0–99)
Total CHOL/HDL Ratio: 5.8 RATIO
Triglycerides: 118 mg/dL (ref <150)
VLDL: 24 mg/dL (ref 0–40)

## 2014-03-18 LAB — COMPREHENSIVE METABOLIC PANEL
ALT: 13 U/L (ref 0–35)
AST: 21 U/L (ref 0–37)
Albumin: 3.6 g/dL (ref 3.5–5.2)
Alkaline Phosphatase: 76 U/L (ref 39–117)
Anion gap: 4 — ABNORMAL LOW (ref 5–15)
BUN: 11 mg/dL (ref 6–23)
CO2: 27 mmol/L (ref 19–32)
Calcium: 9 mg/dL (ref 8.4–10.5)
Chloride: 111 mmol/L (ref 96–112)
Creatinine, Ser: 0.96 mg/dL (ref 0.50–1.10)
GFR calc Af Amer: 59 mL/min — ABNORMAL LOW (ref >90)
GFR calc non Af Amer: 51 mL/min — ABNORMAL LOW (ref >90)
Glucose, Bld: 107 mg/dL — ABNORMAL HIGH (ref 70–99)
Potassium: 4.2 mmol/L (ref 3.5–5.1)
Sodium: 142 mmol/L (ref 135–145)
Total Bilirubin: 0.8 mg/dL (ref 0.3–1.2)
Total Protein: 5.9 g/dL — ABNORMAL LOW (ref 6.0–8.3)

## 2014-03-18 LAB — PROTIME-INR
INR: 2.88 — ABNORMAL HIGH (ref 0.00–1.49)
Prothrombin Time: 30.4 seconds — ABNORMAL HIGH (ref 11.6–15.2)

## 2014-03-18 MED ORDER — ATORVASTATIN CALCIUM 10 MG PO TABS
20.0000 mg | ORAL_TABLET | Freq: Every day | ORAL | Status: DC
  Administered 2014-03-18: 20 mg via ORAL
  Filled 2014-03-18: qty 2

## 2014-03-18 MED ORDER — WARFARIN SODIUM 5 MG PO TABS
5.0000 mg | ORAL_TABLET | Freq: Every day | ORAL | Status: DC
  Administered 2014-03-18: 5 mg via ORAL
  Filled 2014-03-18: qty 1

## 2014-03-18 NOTE — Progress Notes (Signed)
ANTICOAGULATION CONSULT NOTE - Follow Up Consult  Pharmacy Consult for Warfarin Indication: h/o CVA  No Known Allergies  Patient Measurements: Height: 5\' 3"  (160 cm) Weight: 183 lb 13.8 oz (83.4 kg) IBW/kg (Calculated) : 52.4  Vital Signs: Temp: 98.8 F (37.1 C) (02/21 0921) Temp Source: Oral (02/21 0921) BP: 138/69 mmHg (02/21 0921) Pulse Rate: 53 (02/21 0921)  Labs:  Recent Labs  03/16/14 1747 03/16/14 1750 03/17/14 0502 03/17/14 1250 03/18/14 0500 03/18/14 1039  HGB 15.5*  --  14.9  --  14.6  --   HCT 46.7*  --  44.4  --  44.0  --   PLT 224  --  211  --  198  --   LABPROT 23.5*  --  23.8*  --   --  30.4*  INR 2.08*  --  2.11*  --   --  2.88*  CREATININE 1.05  --  0.96  --  0.96  --   TROPONINI  --  <0.03  --  <0.03  --   --     Estimated Creatinine Clearance: 40.6 mL/min (by C-G formula based on Cr of 0.96).   Medications:  Prescriptions prior to admission  Medication Sig Dispense Refill Last Dose  . LORazepam (ATIVAN) 1 MG tablet Take 1-1.5 mg by mouth 2 (two) times daily. 1.5mg  once a day and then take 1mg  at bedtime prn for anxiety/sleep   03/15/2014 at Unknown time  . traMADol (ULTRAM) 50 MG tablet Take 50 mg by mouth 2 (two) times daily.   03/15/2014 at Unknown time  . warfarin (COUMADIN) 5 MG tablet Take 5 mg by mouth daily.   03/15/2014 at Unknown time    Assessment: CVA  81y/o F with hx of dementia [facility].  Two weeks ago she had a mechanical fall and hit her head. She is on coumadin for remote hx of TIA 20 years ago. CT of the head showed no intracranial bleeding. MRI+ CVA  Anticoagulation: h/o TIA. Admit INR 2.08 on 5mg  daily . INR 2.88 today. CBc WNL  Cardiovascular: VSS. LDL 149 placed on Lipitor Endocrinology: Glucose 106 Gastrointestinal / Nutrition: Passed swallow screen. Neurology: Baseline dementia + small parietal infarct Nephrology: Scr 0.96 Hematology / Oncology: WNL PTA Medication Issues: Tramadol and Ativan are  scheduled PTA. Best Practices: Coumadin   Goal of Therapy:  INR 2-3 Monitor platelets by anticoagulation protocol: Yes   Plan:  Continue Coumadin 5mg  daily. Daily INR  Markcus Lazenby S. Merilynn Finlandobertson, PharmD, BCPS Clinical Staff Pharmacist Pager [phone]  Misty Stanleyobertson, Monque Haggar Stillinger 03/18/2014,1:20 PM

## 2014-03-18 NOTE — Evaluation (Signed)
Physical Therapy Evaluation Patient Details Name: Laura Diaz MRN: [id number] DOB: [birth year] Today's Date: 03/18/2014   History of Present Illness  Patient is an 81 yo female admitted 03/16/14 with confusion/delerium.  MRI showed acute infarct right posterior periventricular white matter   PMH:  macular degeneration, renal disorder, CVA, dementia  Clinical Impression  Patient presents with problems listed below.  Will benefit from acute PT to maximize independence prior to discharge home.  Patient lives alone - family is arranging 24 hour assist.  Recommended to patient to use her RW for safety.  Recommend HHPT for continued therapy for balance/mobility.    Follow Up Recommendations Home health PT;Supervision/Assistance - 24 hour    Equipment Recommendations  None recommended by PT    Recommendations for Other Services       Precautions / Restrictions Precautions Precautions: Fall Precaution Comments: Multiple falls at home Restrictions Weight Bearing Restrictions: No      Mobility  Bed Mobility Overal bed mobility: Modified Independent             General bed mobility comments: Increased time and use of rail  Transfers Overall transfer level: Needs assistance Equipment used: None Transfers: Sit to/from Stand Sit to Stand: Min guard         General transfer comment: Assist for safety/balance  Ambulation/Gait Ambulation/Gait assistance: Min assist Ambulation Distance (Feet): 110 Feet Assistive device: None Gait Pattern/deviations: Step-through pattern;Decreased stride length;Staggering left;Staggering right;Wide base of support;Trunk flexed Gait velocity: Decreased Gait velocity interpretation: Below normal speed for age/gender General Gait Details: Patient with unsteady gait, staggering to both sides.  Able to self-correct majority of the time.  Did need assist to regain balance x2.  Patient also reaching for rail in hallway for support.  Stairs             Wheelchair Mobility    Modified Rankin (Stroke Patients Only) Modified Rankin (Stroke Patients Only) Pre-Morbid Rankin Score: Moderate disability Modified Rankin: Moderately severe disability     Balance Overall balance assessment: Needs assistance         Standing balance support: No upper extremity supported;During functional activity Standing balance-Leahy Scale: Fair Standing balance comment: Unsteady during gait.                             Pertinent Vitals/Pain Pain Assessment: No/denies pain    Home Living Family/patient expects to be discharged to:: Private residence Living Arrangements: Alone Available Help at Discharge: Family;Personal care attendant;Available 24 hours/day (Son/daughter arranged to stay with patient.) Type of Home: House Home Access: Level entry     Home Layout: One level (2 steps to living area) Home Equipment: Emergency planning/management officerhower seat;Walker - 2 wheels;Cane - single point Additional Comments: Patient has someone who comes to her home 2x/week for 3 hours each time to do housekeeping, laundry, etc.  No personal care.  Per son, he and daughter have discussed providing 24 hour assist at patient's home.  Do not want SNF.    Prior Function Level of Independence: Independent;Needs assistance      ADL's / Homemaking Assistance Needed: Assist for homemaking        Hand Dominance   Dominant Hand: Right    Extremity/Trunk Assessment   Upper Extremity Assessment: Defer to OT evaluation           Lower Extremity Assessment: Generalized weakness      Cervical / Trunk Assessment: Normal  Communication   Communication: No difficulties  Cognition Arousal/Alertness: Awake/alert Behavior During Therapy: WFL for tasks assessed/performed Overall Cognitive Status: History of cognitive impairments - at baseline (Oriented. Difficulty with questions about falls/living sit)                      General Comments       Exercises        Assessment/Plan    PT Assessment Patient needs continued PT services  PT Diagnosis Difficulty walking;Generalized weakness;Altered mental status   PT Problem List Decreased strength;Decreased activity tolerance;Decreased balance;Decreased mobility;Decreased cognition;Decreased knowledge of use of DME  PT Treatment Interventions DME instruction;Gait training;Functional mobility training;Stair training;Therapeutic activities;Patient/family education   PT Goals (Current goals can be found in the Care Plan section) Acute Rehab PT Goals Patient Stated Goal: To be able to go home PT Goal Formulation: With patient/family Time For Goal Achievement: 03/25/14 Potential to Achieve Goals: Good    Frequency Min 4X/week   Barriers to discharge Decreased caregiver support Patient lives alone.  Family is arranging 24 hour assist for home    Co-evaluation               End of Session Equipment Utilized During Treatment: Gait belt Activity Tolerance: Patient limited by fatigue Patient left: in bed;with call bell/phone within reach;with bed alarm set;with family/visitor present Nurse Communication: Mobility status         Time: 1610-9604 PT Time Calculation (min) (ACUTE ONLY): 28 min   Charges:   PT Evaluation $Initial PT Evaluation Tier I: 1 Procedure PT Treatments $Gait Training: 8-22 mins   PT G CodesVena Austria 04/14/14, 3:50 PM Durenda Hurt. Renaldo Fiddler, [facility] Acute [facility]hab Services Pager [phone]

## 2014-03-18 NOTE — Progress Notes (Signed)
[facility] PROGRESS NOTE  Laura Diaz WUJ:[id number] DOB: [birth year] DOA: 03/16/2014 PCP: [name], MD  Assessment/Plan: 1. Acute CVA 1. Acute 5mm CVA involving R post periventricular white matter 2. Likely source of below delerium 3. 2d echo pending.  and B carotid dopplers: no significant stenosis.  4. PT/OT/SLP ordered.  5. Continue coumadin for now per Neurology input 6. LDL at 149, will start statins.  2. Delerium 1. Suspect secondary to above acute CVA 2. Stable at present 3. Dementia 1. Stable 4. Macular degeneration 1. Stable currently 5. Chronic anticoagulation 1. No signs of afib 2. Cont coumadin for now per above 3. Cont to monitor INR closely, dosing per pharmacy 6. Sinus bradycardia 1. HR in the 50's on EKG 2. Pt evaluated and remains asymptomatic 3. serial trop 4. F/u 2d echo per above 5. Cont on monitor for now 6. No av nodal agents on med list 7. DVTprophylaxis 1. On therapeutic coumadin  Code Status: DNR Family Communication: Pt in room, son at bedside Disposition Plan: await PT, OT evaluation./    Consultants:  Neurology  Procedures:  MRI brain  Antibiotics:  none (indicate start date, and stop date if known)  HPI/Subjective: Pt is without complaints. Denies focal weakness She was alert and oriented to place and situation.   Objective: Filed Vitals:   03/18/14 0300 03/18/14 0500 03/18/14 0921 03/18/14 1327  BP: 137/59 125/63 138/69 113/63  Pulse: 58 77 53 69  Temp: 98 F (36.7 C) 98 F (36.7 C) 98.8 F (37.1 C) 98.2 F (36.8 C)  TempSrc: Oral Oral Oral Oral  Resp: Height:      Weight:      SpO2:  98% 100% 98%    Intake/Output Summary (Last 24 hours) at 03/18/14 1418 Last data filed at 03/17/14 1700  Gross per 24 hour  Intake    360 ml  Output      0 ml  Net    360 ml   Filed Weights   03/17/14 0128  Weight: 83.4 kg (183 lb 13.8 oz)    Exam:   General:  Awake, eating, in  nad  Cardiovascular: regular, bradycardic, s1, s2  Respiratory: normal resp effrot, no wheezing  Abdomen: soft,nondistended  Musculoskeletal: perfused, no clubbing   Data Reviewed: Basic Metabolic Panel:  Recent Labs Lab 03/16/14 1747 03/17/14 0502 03/18/14 0500  NA 139 139 142  K 3.6 3.6 4.2  CL 107 107 111  CO2 GLUCOSE 113* 106* 107*  BUN CREATININE 1.05 0.96 0.96  CALCIUM 9.7 9.1 9.0  MG  --  2.2  --   PHOS  --  4.1  --    Liver Function Tests:  Recent Labs Lab 03/16/14 1747 03/17/14 0502 03/18/14 0500  AST ALT ALKPHOS 77 72 76  BILITOT 0.7 1.0 0.8  PROT 7.4 6.7 5.9*  ALBUMIN 4.5 3.9 3.6   No results for input(s): LIPASE, AMYLASE in the last 168 hours. No results for input(s): AMMONIA in the last 168 hours. CBC:  Recent Labs Lab 03/16/14 1747 03/17/14 0502 03/18/14 0500  WBC 6.7 5.2 4.3  HGB 15.5* 14.9 14.6  HCT 46.7* 44.4 44.0  MCV 94.2 95.1 95.0  PLT 224 211 198   Cardiac Enzymes:  Recent Labs Lab 03/16/14 1750 03/17/14 1250  TROPONINI <0.03 <0.03   BNP (last 3 results) No results for input(s): BNP in the  last 8760 hours.  ProBNP (last 3 results) No results for input(s): PROBNP in the last 8760 hours.  CBG: No results for input(s): GLUCAP in the last 168 hours.  No results found for this or any previous visit (from the past 240 hour(s)).   Studies: X-ray Chest Pa And Lateral  03/17/2014   CLINICAL DATA:  Increasing confusion over the past few days.  EXAM: CHEST  2 VIEW  COMPARISON:  01/07/2006  FINDINGS: Diaz single AP portable view of the chest demonstrates no focal airspace consolidation or alveolar edema. The lungs are grossly clear. There is no large effusion or pneumothorax. There is unchanged cardiomegaly. Cardiac and mediastinal contours are otherwise unremarkable.  IMPRESSION: No active cardiopulmonary disease.   Electronically Signed   By: Ellery Plunkaniel R Mitchell M.D.   On: 03/17/2014 00:43    Ct Head Wo Contrast  03/16/2014   CLINICAL DATA:  Increased confusion for past few days per family, progressively worse today and this afternoon, disorientation, fell several weeks ago, history of stroke  EXAM: CT HEAD WITHOUT CONTRAST  TECHNIQUE: Contiguous axial images were obtained from the base of the skull through the vertex without intravenous contrast.  COMPARISON:  None  FINDINGS: Generalized atrophy.  Normal ventricular morphology.  No midline shift or mass effect.  Small vessel chronic ischemic changes of deep cerebral white matter.  No intracranial hemorrhage, mass lesion, or acute infarction.  Visualized paranasal sinuses and mastoid air cells clear.  Bones unremarkable.  IMPRESSION: Atrophy with small vessel chronic ischemic changes of deep cerebral white matter.  No acute intracranial abnormalities.   Electronically Signed   By: Ulyses SouthwardMark  Boles M.D.   On: 03/16/2014 19:47   Mr Brain Wo Contrast  03/17/2014   CLINICAL DATA:  Encephalopathy.  Mental status change  EXAM: MRI HEAD WITHOUT CONTRAST  TECHNIQUE: Multiplanar, multiecho pulse sequences of the brain and surrounding structures were obtained without intravenous contrast.  COMPARISON:  CT 03/16/2014  FINDINGS: Mild to moderate atrophy. Mild ventricular enlargement consistent with the degree of atrophy. Craniocervical junction normal. Pituitary not enlarged.  5 mm acute infarct in the right posterior temporal periventricular white matter. No other acute infarct  Moderate chronic microvascular ischemic change throughout the white matter and pons.  Negative for hemorrhage  Negative for mass or edema.  IMPRESSION: 5 mm acute infarct right posterior periventricular white matter .  Atrophy and moderate chronic microvascular ischemia.   Electronically Signed   By: Marlan Palauharles  Clark M.D.   On: 03/17/2014 10:23    Scheduled Meds: . atorvastatin  20 mg Oral q1800  . docusate sodium  100 mg Oral BID  . sodium chloride  3 mL Intravenous Q12H  . sodium  chloride  3 mL Intravenous Q12H  . warfarin  5 mg Oral q1800  . Warfarin - Pharmacist Dosing Inpatient   Does not apply q1800   Continuous Infusions:   Active Problems:   Delirium   Dementia   Encephalopathy acute   Stroke   Laura Diaz, Laura Diaz  [facility] Pager [phone]. If 7PM-7AM, please contact night-coverage at [email or web address], password [email or web address] 03/18/2014, 2:18 PM  LOS: 2 days

## 2014-03-18 NOTE — Progress Notes (Signed)
STROKE TEAM PROGRESS NOTE   HISTORY Laura Diaz is an 81 y.o. female with a history of dementia who lives at home and was seen to be confused beginning several days before admission. It was noted that she was having a difficult time following directions, and trying to use her purse as a shoe. It was felt that she was likely delirious and [facility]  As part of that workup she received an MRI which does show a right parietal infarct which is relatively small.   LKW: 2/17 tpa given?: no, outside of window   SUBJECTIVE (INTERVAL HISTORY) No family members present. The patient knows that she was on Coumadin in the past but does not know why. She is without complaints at this time.   OBJECTIVE Temp:  [97.4 F (36.3 C)-98.8 F (37.1 C)] 98 F (36.7 C) (02/21 0500) Pulse Rate:  [47-77] 77 (02/21 0500) Cardiac Rhythm:  [-] Heart block (02/20 2000) Resp:  [16-20] 18 (02/21 0500) BP: (114-148)/(46-79) 125/63 mmHg (02/21 0500) SpO2:  [96 %-100 %] 98 % (02/21 0500)  No results for input(s): GLUCAP in the last 168 hours.  Recent Labs Lab 03/16/14 1747 03/17/14 0502  NA 139 139  K 3.6 3.6  CL 107 107  CO2 24 25  GLUCOSE 113* 106*  BUN 17 15  CREATININE 1.05 0.96  CALCIUM 9.7 9.1  MG  --  2.2  PHOS  --  4.1    Recent Labs Lab 03/16/14 1747 03/17/14 0502  AST 23 22  ALT 14 15  ALKPHOS 77 72  BILITOT 0.7 1.0  PROT 7.4 6.7  ALBUMIN 4.5 3.9    Recent Labs Lab 03/16/14 1747 03/17/14 0502 03/18/14 0500  WBC 6.7 5.2 4.3  HGB 15.5* 14.9 14.6  HCT 46.7* 44.4 44.0  MCV 94.2 95.1 95.0  PLT 224 211 198    Recent Labs Lab 03/16/14 1750 03/17/14 1250  TROPONINI <0.03 <0.03    Recent Labs  03/16/14 1747 03/17/14 0502  LABPROT 23.5* 23.8*  INR 2.08* 2.11*    Recent Labs  03/16/14 1843  COLORURINE YELLOW  LABSPEC 1.023  PHURINE 5.0  GLUCOSEU NEGATIVE  HGBUR NEGATIVE  BILIRUBINUR NEGATIVE   KETONESUR NEGATIVE  PROTEINUR NEGATIVE  UROBILINOGEN 0.2  NITRITE NEGATIVE  LEUKOCYTESUR NEGATIVE    No results found for: CHOL, TRIG, HDL, CHOLHDL, VLDL, LDLCALC No results found for: HGBA1C No results found for: LABOPIA, COCAINSCRNUR, LABBENZ, AMPHETMU, THCU, LABBARB  No results for input(s): ETH in the last 168 hours.  X-ray Chest Pa And Lateral 03/17/2014    No active cardiopulmonary disease.     Ct Head Wo Contrast 03/16/2014    Atrophy with small vessel chronic ischemic changes of deep cerebral white matter.  No acute intracranial abnormalities.    Mr Brain Wo Contrast 03/17/2014    5 mm acute infarct right posterior periventricular white matter.  Atrophy and moderate chronic microvascular ischemia.     CUS - Bilateral: 1-39% ICA stenosis. Vertebral artery flow is antegrade.  2D echo - - Left ventricle: The cavity size was normal. Wall thickness was increased in a pattern of mild LVH. Systolic function was normal. The estimated ejection fraction was in the range of 55% to 60%. Wall motion was normal; there were no regional wall motion abnormalities. Doppler parameters are consistent with abnormal left ventricular relaxation (grade 1 diastolic dysfunction).  Impressions: - No cardiac source of emboli was indentified.  PHYSICAL  EXAM  Temp:  [97.9 F (36.6 C)-98.8 F (37.1 C)] 98.2 F (36.8 C) (02/21 1724) Pulse Rate:  [53-77] 70 (02/21 1724) Resp:  [16-18] 16 (02/21 1724) BP: (100-148)/(57-79) 100/71 mmHg (02/21 1724) SpO2:  [96 %-100 %] 98 % (02/21 1724)  General - Well nourished, well developed, in no apparent distress.  Ophthalmologic - fundi not visualized due to incorporation.  Cardiovascular - Regular rhythm with no murmur, but bradycardic.  Mental Status -  Level of arousal and orientation to time, place, and person were intact. Language including expression, naming, repetition, comprehension was assessed and found intact.  Cranial Nerves II  - XII - II - Visual field intact OU. III, IV, VI - Extraocular movements intact. V - Facial sensation intact bilaterally. VII - Facial movement intact bilaterally. VIII - Hard of hearing & vestibular intact bilaterally. X - Palate elevates symmetrically. XI - Chin turning & shoulder shrug intact bilaterally. XII - Tongue protrusion intact.  Motor Strength - The patient's strength was 5-/5 in all extremities and pronator drift was absent.  Bulk was normal and fasciculations were absent.   Motor Tone - Muscle tone was assessed at the neck and appendages and was normal.  Reflexes - The patient's reflexes were 1+ in all extremities and she had no pathological reflexes.  Sensory - Light touch, temperature/pinprick were assessed and were normal.    Coordination - The patient had normal movements in the hands with no ataxia or dysmetria.  Tremor was absent.  Gait and Station - not tested due to safety concerns.   ASSESSMENT/PLAN Laura Diaz is a 81 y.o. female with history of macular degeneration, renal disorder, and a previous stroke/TIA on coumadin presenting with AMS. She did not receive IV t-PA due to late presentation   Stroke:  Non-dominant right parietal white matter punctate infarct - likely due to small vessel disease. Not completely convinced this is the cause of her AMS. However no other etiology has been found so far.   Resultant no significant deficits  MRI  right parietal white matter punctate infarct  MRA  not ordered  Carotid Doppler  unremarkable   2D Echo unremarkable  LDL 149, not at goal  HgbA1c pending  Warfarin for VTE prophylaxis  Diet Heart with thin liquids  warfarin prior to admission, now on warfarin. Continue Coumadin for chronic anticoagulation.  Ongoing aggressive stroke risk factor management  Therapy recommendations: Home health physical therapy recommended  Disposition:  Pending  Hyperlipidemia  Home meds: No lipid lowering  medications prior to admission  LDL 149, goal < 70  Now on Lipitor 20 mg daily   Continue statin at discharge  Diabetes  HgbA1c pending, goal < 7.0  Controlled  Bradycardia  EKG showed first-degree AV block  Telemetry showed secondary AV block with rates in the 30s at times and 2.46 second pause.  Consider cardiology consult if patient is symptomatic.  No documented atrial fibrillation  Patient on antipronation with Coumadin.   Other Stroke Risk Factors  Advanced age  Obesity, Body mass index is 32.58 kg/(m^2).   Hx stroke/TIA on Coumadin  Other Active Problems    Other Pertinent History  Patient lives alone  Hospital day # 2  Delton See PA-C [facility] Pager [phone] 03/18/2014, 8:41 AM  I, the attending vascular neurologist, have personally obtained a history, examined the patient, evaluated laboratory data, individually viewed imaging studies and agree with radiology interpretations. I also discussed with Dr. Sunnie Nielsen regarding her care  plan. Together with the NP/PA, we formulated the assessment and plan of care which reflects our mutual decision.  I have made any additions or clarifications directly to the above note and agree with the findings and plan as currently documented.   81 year old female with history of chronic anticoagulation presumed due to TIA 20 years ago admitted for altered mental status. MRI found right parietal white matter punctate acute infarct. Not sure whether this is able to explain patient altered mental status, however no any other explanation so far. Patient back to baseline now. We'll not change her regimen, agree to continue Coumadin. LDL was high, continue statin for stroke prevention.   Neurology will sign off. Please call with questions. Thanks for the consult.  Marvel Plan, MD PhD Stroke Neurology 03/18/2014 6:10 PM    To contact Stroke Continuity provider, please refer to [email or web address]. After hours,  contact General Neurology

## 2014-03-18 NOTE — Progress Notes (Signed)
Utilization review completed.  

## 2014-03-18 NOTE — Progress Notes (Signed)
VASCULAR LAB PRELIMINARY  PRELIMINARY  PRELIMINARY  PRELIMINARY  Carotid Dopplers completed.    Preliminary report:  1-39% ICA stenosis.  Vertebral artery flow is antegrade.   Ivie Savitt, RVT 03/18/2014, 11:36 AM

## 2014-03-18 NOTE — Progress Notes (Signed)
SLP Cancellation Note  Patient Details Name: Laura KillingsClara Marie Creppel MRN: [id number] DOB: 03-02-1922   Cancelled treatment:       Reason Eval/Treat Not Completed: Pt passed the RN stroke swallow screen and has been consuming regular diet and thin liquids with no concerns per RN. Therefore will defer swallow evaluation per protocol. Given acute CVA, recommend to consider SLP cognitive-linguistic evaluation.   Maxcine HamLaura Paiewonsky, M.A. CCC-SLP [phone]  Maxcine Hamaiewonsky, Ouida Abeyta 03/18/2014, 12:15 PM

## 2014-03-18 NOTE — Progress Notes (Signed)
  Echocardiogram 2D Echocardiogram has been performed.  Virgina EvenerRix, Jada Kuhnert A 03/18/2014, 3:00 PM

## 2014-03-18 NOTE — Evaluation (Signed)
Occupational Therapy Evaluation Patient Details Name: Laura Diaz MRN: [id number] DOB: 1922/11/30 Today's Date: 03/18/2014    History of Present Illness Patient is an 81 yo female admitted 03/16/14 with confusion/delerium.  MRI showed acute infarct right posterior periventricular white matter   PMH:  macular degeneration, renal disorder, CVA, dementia   Clinical Impression   PTA pt lived at home alone and was independent with ADLs. Pt currently requires min A for functional mobility and is resistant to using a RW for balance. Pt's children have planned to provide 24/7 Supervision. Pt will benefit from acute OT to increase safety and independence with functional mobility and ADLs.     Follow Up Recommendations  Supervision/Assistance - 24 hour;No OT follow up    Equipment Recommendations  None recommended by OT    Recommendations for Other Services       Precautions / Restrictions Precautions Precautions: Fall Precaution Comments: Multiple falls at home Restrictions Weight Bearing Restrictions: No      Mobility Bed Mobility Overal bed mobility: Modified Independent             General bed mobility comments: Increased time. Pt rocked her self up to sitting without use of rail and HOB flat  Transfers Overall transfer level: Needs assistance Equipment used: None Transfers: Sit to/from Stand Sit to Stand: Min guard         General transfer comment: Assist for safety/balance    Balance Overall balance assessment: Needs assistance Sitting-balance support: No upper extremity supported;Feet supported Sitting balance-Leahy Scale: Good     Standing balance support: Single extremity supported;During functional activity Standing balance-Leahy Scale: Poor Standing balance comment: requires UE support during functional mobility                            ADL Overall ADL's : Needs assistance/impaired Eating/Feeding: Independent;Sitting   Grooming:  Min guard;Standing   Upper Body Bathing: Set up;Sitting   Lower Body Bathing: Min guard;Sit to/from stand   Upper Body Dressing : Set up;Sitting   Lower Body Dressing: Min guard;Sit to/from stand Lower Body Dressing Details (indicate cue type and reason): pt able to perform figure four method to doff and don socks Toilet Transfer: Minimal assistance;Ambulation;Comfort height toilet Toilet Transfer Details (indicate cue type and reason): min A for balance Toileting- Clothing Manipulation and Hygiene: Min guard;Sit to/from stand   Tub/ Shower Transfer: Walk-in shower;Minimal assistance;Ambulation   Functional mobility during ADLs: Minimal assistance (hand held assist)       Vision  Pt wears glasses at baseline and reports no visual changes.           Pertinent Vitals/Pain Pain Assessment: No/denies pain     Hand Dominance Right   Extremity/Trunk Assessment Upper Extremity Assessment Upper Extremity Assessment: Overall WFL for tasks assessed   Lower Extremity Assessment Lower Extremity Assessment: Generalized weakness   Cervical / Trunk Assessment Cervical / Trunk Assessment: Normal   Communication Communication Communication: HOH   Cognition Arousal/Alertness: Awake/alert Behavior During Therapy: WFL for tasks assessed/performed Overall Cognitive Status: History of cognitive impairments - at baseline (oriented. Mild difficulty with answering some PLOF questions)                                Home Living Family/patient expects to be discharged to:: Private residence Living Arrangements: Alone Available Help at Discharge: Family;Personal care attendant;Available 24 hours/day (son/daughter have arranged to  stay with patient) Type of Home: House Home Access: Level entry     Home Layout: One level (2 steps to living area)     Bathroom Shower/Tub: Tub/shower unit;Walk-in shower Shower/tub characteristics: Engineer, building services: Standard     Home  Equipment: Emergency planning/management officer - 2 wheels;Cane - single point   Additional Comments: Patient has someone who comes to her home 2x/week for 3 hours each time to do housekeeping, laundry, etc.  No personal care.  Per son, he and daughter have discussed providing 24 hour assist at patient's home.  Do not want SNF.      Prior Functioning/Environment Level of Independence: Independent    ADL's / Homemaking Assistance Needed: Assist for homemaking        OT Diagnosis: Generalized weakness   OT Problem List: Decreased strength;Decreased activity tolerance;Impaired balance (sitting and/or standing);Decreased safety awareness   OT Treatment/Interventions: Self-care/ADL training;Therapeutic exercise;Energy conservation;DME and/or AE instruction;Therapeutic activities;Patient/family education;Balance training    OT Goals(Current goals can be found in the care plan section) Acute Rehab OT Goals Patient Stated Goal: to go home soon OT Goal Formulation: With patient/family Time For Goal Achievement: 04/01/14 Potential to Achieve Goals: Good ADL Goals Pt Will Perform Grooming: with supervision;standing Pt Will Perform Lower Body Bathing: sit to/from stand;with modified independence Pt Will Perform Lower Body Dressing: with modified independence;sit to/from stand Pt Will Transfer to Toilet: with supervision;ambulating Pt Will Perform Toileting - Clothing Manipulation and hygiene: with modified independence;sit to/from stand Pt Will Perform Tub/Shower Transfer: with modified independence;ambulating;rolling walker  OT Frequency: Min 1X/week    End of Session Equipment Utilized During Treatment: Gait belt Nurse Communication: Mobility status  Activity Tolerance: Patient tolerated treatment well Patient left: in bed;with call bell/phone within reach;with bed alarm set;with family/visitor present   Time: 1610-9604 OT Time Calculation (min): 17 min Charges:  OT General Charges $OT Visit: 1  Procedure OT Evaluation $Initial OT Evaluation Tier I: 1 Procedure G-Codes:    Rae Lips March 20, 2014, 4:27 PM  Carney Living, OTR/L Occupational Therapist [phone] (pager)

## 2014-03-19 LAB — HEMOGLOBIN A1C
Hgb A1c MFr Bld: 6.2 % — ABNORMAL HIGH (ref 4.8–5.6)
Hgb A1c MFr Bld: 6 % — ABNORMAL HIGH (ref 4.8–5.6)
Mean Plasma Glucose: 131 mg/dL
Mean Plasma Glucose: 126 mg/dL

## 2014-03-19 LAB — FOLATE RBC
Folate, Hemolysate: 415.6 ng/mL
Folate, RBC: 890 ng/mL (ref >498)
Hematocrit: 46.7 % — ABNORMAL HIGH (ref 34.0–46.6)

## 2014-03-19 LAB — PROTIME-INR
INR: 3.5 — ABNORMAL HIGH (ref 0.00–1.49)
Prothrombin Time: 35.4 seconds — ABNORMAL HIGH (ref 11.6–15.2)

## 2014-03-19 MED ORDER — WARFARIN SODIUM 5 MG PO TABS: ORAL_TABLET | ORAL | Status: DC

## 2014-03-19 MED ORDER — LORAZEPAM 1 MG PO TABS: 1.0000 mg | ORAL_TABLET | Freq: Two times a day (BID) | ORAL | Status: DC | PRN

## 2014-03-19 MED ORDER — ATORVASTATIN CALCIUM 20 MG PO TABS: 20.0000 mg | ORAL_TABLET | Freq: Every day | ORAL | Status: DC

## 2014-03-19 NOTE — Progress Notes (Signed)
Physical Therapy Treatment Patient Details Name: Laura Diaz MRN: [id number] DOB: 06/06/1922 Today's Date: 03/19/2014    History of Present Illness Patient is an 81 yo female admitted 03/16/14 with confusion/delerium.  MRI showed acute infarct right posterior periventricular white matter   PMH:  macular degeneration, renal disorder, CVA, dementia    PT Comments    Pt is progressing well with her mobility, but even with RW, is unsteady with her gait (albeit, better than without RW).  She will need training at home on how to safely use her rollator and continues to be appropriate for HHPT for balance, gait, and strength training.  PT will continue to follow acutely.   Follow Up Recommendations  Home health PT;Supervision/Assistance - 24 hour     Equipment Recommendations  None recommended by PT    Recommendations for Other Services   NA     Precautions / Restrictions Precautions Precautions: Fall Precaution Comments: Multiple falls at home Restrictions Weight Bearing Restrictions: No    Mobility  Bed Mobility Overal bed mobility: Modified Independent             General bed mobility comments: uses momentum and bed rail for leverage to get to sitting EOB.   Transfers Overall transfer level: Needs assistance Equipment used: Rolling walker (2 wheeled) Transfers: Sit to/from Stand Sit to Stand: Min guard         General transfer comment: Min guard assist for safety and balance.  Pt relying on hands for transitions and lower legs rested against bed frame for leverage.   Ambulation/Gait Ambulation/Gait assistance: Supervision;Min guard Ambulation Distance (Feet): 120 Feet Assistive device: Rolling walker (2 wheeled) Gait Pattern/deviations: Step-through pattern;Shuffle;Staggering right;Staggering left Gait velocity: decreased Gait velocity interpretation: Below normal speed for age/gender General Gait Details: Pt with staggering gait pattern, better wtih RW than  without, however, pt has significant learning curve on how to safely use RW.  She also has rollator at home not regular RW like we used today.  Supervision mostly, except when turning, then pt needed min guard assist as she would get outside of RW and need assistance to get back in safe area.    Stairs Stairs: Yes Stairs assistance: Min assist Stair Management: One rail Right;Alternating pattern;Forwards Number of Stairs: 5 General stair comments: Pt very high guard on the stairs.  She has 2 steps to get down into her kitchen area and there are no railings there.  She needs min assist to stabilize for balance and to help power up over functionally weak legs.   Modified Rankin (Stroke Patients Only) Modified Rankin (Stroke Patients Only) Pre-Morbid Rankin Score: Moderate disability Modified Rankin: Moderately severe disability     Balance Overall balance assessment: Needs assistance Sitting-balance support: Feet supported;No upper extremity supported Sitting balance-Leahy Scale: Good     Standing balance support: Single extremity supported;Bilateral upper extremity supported;No upper extremity supported Standing balance-Leahy Scale: Poor                      Cognition Arousal/Alertness: Awake/alert Behavior During Therapy: WFL for tasks assessed/performed Overall Cognitive Status: History of cognitive impairments - at baseline                             Pertinent Vitals/Pain Pain Assessment: No/denies pain           PT Goals (current goals can now be found in the care plan section) Acute Rehab PT  Goals Patient Stated Goal: to go home today Progress towards PT goals: Progressing toward goals    Frequency  Min 4X/week    PT Plan Current plan remains appropriate       End of Session   Activity Tolerance: Patient tolerated treatment well Patient left: in chair;with call bell/phone within reach;with family/visitor present     Time: [id number] PT  Time Calculation (min) (ACUTE ONLY): 23 min  Charges:  $Gait Training: 8-22 mins $Therapeutic Activity: 8-22 mins                      Ilea Hilton B. Retta Pitcher, PT, DPT [phone]   03/19/2014, 11:16 AM

## 2014-03-19 NOTE — Discharge Summary (Signed)
Physician Discharge Summary  Laura Diaz RUE:[id number] DOB: February 11, 1922 DOA: 03/16/2014  PCP: [name], MD  Admit date: 03/16/2014 Discharge date: 03/19/2014  Time spent: 35 minutes  Recommendations for Outpatient Follow-up:  1. Needs INR in 24 hour, adjust coumadin as needed.  2. Family wants to titrate off ativan, needs to follow up with PCP fr that.  3. H A 1 c pending. Needs counseling for diet,.  4. Need evaluation for sleep apnea.   Discharge Diagnoses:    Acute CVA   Delirium   Dementia   Encephalopathy acute   Stroke   Confusion with non-focal neuro exam   Hyperlipidemia   Discharge Condition: stable.   Diet recommendation: heart healthy  Filed Weights   03/17/14 0128  Weight: 83.4 kg (183 lb 13.8 oz)    History of present illness:  Laura Diaz is a 81 y.o. female   has a past medical history of Macular degeneration; Renal disorder; and Stroke.   Presented with  Patient has hx of Dementia at her baseline she used to drive. She has a care taker that comes twice a week.  2 weeks ago she had a mechanical fall and hit her head. She is on coumadin for remote hx of TIA 20 years ago. CT of the head showed no intracranial bleeding. 2 days ago she started to have sudden worsening confusion. She had a dramatic change with inability to follow directions and trying to use her purse as a shoe. Denies any fever, good PO intake, no cough. NO slurred speech no localized weakness. She has needed more help with walking but now back to baseline.  Hospitalist was called for admission for Delirium  Hospital Course:  1. Acute CVA 1. Acute 5mm CVA involving R post periventricular white matter 2. Likely source of below delerium 3. 2d echo no cardiac source of embolism. and B carotid dopplers: no significant stenosis.  4. Continue coumadin for now per Neurology input 5. LDL at 149, started on statins.  2. Delerium 1. Suspect secondary to above acute CVA vs  medications.  2. Stable at present 3. Dementia 1. Stable 4. Macular degeneration 1. Stable currently 5. Chronic anticoagulation 1. No signs of afib 2. Cont coumadin for now per above 3. Cont to monitor INR closely, dosing per pharmacy. Hold dose for today. Check INR tomorrow.  6. Sinus bradycardia 1. HR in the 50's on EKG 2. Pt evaluated and remains asymptomatic 3. serial trop 4. F/u 2d echo per above 5. Cont on monitor for now 6. No av nodal agents on med list 7. DVTprophylaxis 1. On therapeutic coumadin Procedures:  ECHO; No cardiac source of emboli was indentified.  Doppler; Bilateral: intimal wall thickening CCA. Mild mixed plaque origin ICA. 1-39% ICA stenosis. Vertebral artery flow is antegrade.  Consultations:  neuro  Discharge Exam: Filed Vitals:   03/19/14 0627  BP: 133/54  Pulse: 58  Temp: 89.4 F (31.9 C)  Resp: 16    General: No distress.  Cardiovascular: S 1, S 2 RRR Respiratory: CTA  Discharge Instructions   Discharge Instructions    Diet - low sodium heart healthy    Complete by:  As directed      Increase activity slowly    Complete by:  As directed           Current Discharge Medication List    START taking these medications   Details  atorvastatin (LIPITOR) 20 MG tablet Take 1 tablet (20 mg total) by mouth  daily at 6 PM. Qty: 30 tablet, Refills: 0      CONTINUE these medications which have CHANGED   Details  LORazepam (ATIVAN) 1 MG tablet Take 1 tablet (1 mg total) by mouth 2 (two) times daily as needed for anxiety. Qty: 30 tablet, Refills: 0    warfarin (COUMADIN) 5 MG tablet Do not take coumadin on 2-22. INR check on 2-23 Qty: 30 tablet, Refills: 0      CONTINUE these medications which have NOT CHANGED   Details  traMADol (ULTRAM) 50 MG tablet Take 50 mg by mouth 2 (two) times daily.       No Known Allergies Follow-up Information    Follow up with Kristie CowmanSCHOOLER, [name], MD In 1 week.   Specialty:  Internal Medicine    Contact information:   [address] [place] [address] [phone]       Follow up with Xu,Jindong, MD In 2 weeks.   Specialty:  Neurology   Contact information:   [address] [place] [id number] [phone]        The results of significant diagnostics from this hospitalization (including imaging, microbiology, ancillary and laboratory) are listed below for reference.    Significant Diagnostic Studies: X-ray Chest Pa And Lateral  03/17/2014   CLINICAL DATA:  Increasing confusion over the past few days.  EXAM: CHEST  2 VIEW  COMPARISON:  01/07/2006  FINDINGS: A single AP portable view of the chest demonstrates no focal airspace consolidation or alveolar edema. The lungs are grossly clear. There is no large effusion or pneumothorax. There is unchanged cardiomegaly. Cardiac and mediastinal contours are otherwise unremarkable.  IMPRESSION: No active cardiopulmonary disease.   Electronically Signed   By: Ellery Plunkaniel R Mitchell M.D.   On: 03/17/2014 00:43   Ct Head Wo Contrast  03/16/2014   CLINICAL DATA:  Increased confusion for past few days per family, progressively worse today and this afternoon, disorientation, fell several weeks ago, history of stroke  EXAM: CT HEAD WITHOUT CONTRAST  TECHNIQUE: Contiguous axial images were obtained from the base of the skull through the vertex without intravenous contrast.  COMPARISON:  None  FINDINGS: Generalized atrophy.  Normal ventricular morphology.  No midline shift or mass effect.  Small vessel chronic ischemic changes of deep cerebral white matter.  No intracranial hemorrhage, mass lesion, or acute infarction.  Visualized paranasal sinuses and mastoid air cells clear.  Bones unremarkable.  IMPRESSION: Atrophy with small vessel chronic ischemic changes of deep cerebral white matter.  No acute intracranial abnormalities.   Electronically Signed   By: Ulyses SouthwardMark  Boles M.D.   On: 03/16/2014 19:47   Mr Brain Wo  Contrast  03/17/2014   CLINICAL DATA:  Encephalopathy.  Mental status change  EXAM: MRI HEAD WITHOUT CONTRAST  TECHNIQUE: Multiplanar, multiecho pulse sequences of the brain and surrounding structures were obtained without intravenous contrast.  COMPARISON:  CT 03/16/2014  FINDINGS: Mild to moderate atrophy. Mild ventricular enlargement consistent with the degree of atrophy. Craniocervical junction normal. Pituitary not enlarged.  5 mm acute infarct in the right posterior temporal periventricular white matter. No other acute infarct  Moderate chronic microvascular ischemic change throughout the white matter and pons.  Negative for hemorrhage  Negative for mass or edema.  IMPRESSION: 5 mm acute infarct right posterior periventricular white matter .  Atrophy and moderate chronic microvascular ischemia.   Electronically Signed   By: Marlan Palauharles  Clark M.D.   On: 03/17/2014 10:23    Microbiology: No  results found for this or any previous visit (from the past 240 hour(s)).   Labs: Basic Metabolic Panel:  Recent Labs Lab 03/16/14 1747 03/17/14 0502 03/18/14 0500  NA 139 139 142  K 3.6 3.6 4.2  CL 107 107 111  CO2 GLUCOSE 113* 106* 107*  BUN CREATININE 1.05 0.96 0.96  CALCIUM 9.7 9.1 9.0  MG  --  2.2  --   PHOS  --  4.1  --    Liver Function Tests:  Recent Labs Lab 03/16/14 1747 03/17/14 0502 03/18/14 0500  AST ALT ALKPHOS 77 72 76  BILITOT 0.7 1.0 0.8  PROT 7.4 6.7 5.9*  ALBUMIN 4.5 3.9 3.6   No results for input(s): LIPASE, AMYLASE in the last 168 hours. No results for input(s): AMMONIA in the last 168 hours. CBC:  Recent Labs Lab 03/16/14 1747 03/17/14 0502 03/18/14 0500  WBC 6.7 5.2 4.3  HGB 15.5* 14.9 14.6  HCT 46.7* 44.4 44.0  MCV 94.2 95.1 95.0  PLT 224 211 198   Cardiac Enzymes:  Recent Labs Lab 03/16/14 1750 03/17/14 1250  TROPONINI <0.03 <0.03   BNP: BNP (last 3 results) No results for input(s): BNP in the last  8760 hours.  ProBNP (last 3 results) No results for input(s): PROBNP in the last 8760 hours.  CBG: No results for input(s): GLUCAP in the last 168 hours.     SignedHartley Barefoot A  [facility] 03/19/2014, 10:06 AM

## 2014-03-19 NOTE — Progress Notes (Signed)
Patient Heart down in the 30;s  Asymptomatic. N.P made aware no new orders. To monitor  Pt. HR currently 58. Will continue to monitor.

## 2014-03-19 NOTE — Progress Notes (Signed)
Occupational Therapy Treatment Patient Details Name: Laura KillingsClara Marie Deboer MRN: [id number] DOB: February 25, 1922 Today's Date: 03/19/2014    History of present illness Patient is an 81 yo female admitted 03/16/14 with confusion/delerium.  MRI showed acute infarct right posterior periventricular white matter   PMH:  macular degeneration, renal disorder, CVA, dementia   OT comments  Patient progressing towards goals, continue plan of care. Reiterated importance of 24/7 supervision/assistance at home and encouraged BSC beside bed for nighttime toileting use. Also reiterated importance of using RW for all mobility at this time.    Follow Up Recommendations  Supervision/Assistance - 24 hour;No OT follow up    Equipment Recommendations  3 in 1 bedside comode    Recommendations for Other Services  None at this time.     Precautions / Restrictions Precautions Precautions: Fall Precaution Comments: Multiple falls at home Restrictions Weight Bearing Restrictions: No       Mobility - Per PT note Bed Mobility Overal bed mobility: Modified Independent General bed mobility comments: uses momentum and bed rail for leverage to get to sitting EOB.   Transfers Overall transfer level: Needs assistance Equipment used: Rolling walker (2 wheeled) Transfers: Sit to/from Stand Sit to Stand: Min guard         General transfer comment: Min guard assist for safety and balance.  Pt relying on hands for transitions and lower legs rested against bed frame for leverage.     Balance - Per PT note Overall balance assessment: Needs assistance Sitting-balance support: Feet supported;No upper extremity supported Sitting balance-Leahy Scale: Good     Standing balance support: Single extremity supported;Bilateral upper extremity supported;No upper extremity supported Standing balance-Leahy Scale: Poor    ADL   Tub/ Shower Transfer: Walk-in shower;Minimal assistance;Ambulation     General ADL Comments:  Patient completed dressing with PT earlier this am. Educated patient and daughter (caregiver) on walk-in shower stall transfers using RW and shower seat. Encouraged stepping over threshold backwards and leaving RW outside shower door. Reiterated importance of patient having 24/7 supervision/assistance for safety secondary to history of multiple falls at home and new altered mental status. Recommending a BSC for beside the bed at night time, daughter states that patient will not use this at home.      Cognition   Behavior During Therapy: WFL for tasks assessed/performed Overall Cognitive Status: History of cognitive impairments - at baseline                 Pertinent Vitals/ Pain       Pain Assessment: No/denies pain         Frequency Min 1X/week     Progress Toward Goals  OT Goals(current goals can now be found in the care plan section)  Progress towards OT goals: Progressing toward goals  Acute Rehab OT Goals Patient Stated Goal: to go home today  Plan Discharge plan remains appropriate       End of Session Equipment Utilized During Treatment: Rolling walker   Activity Tolerance Patient tolerated treatment well   Patient Left in chair;with call bell/phone within reach;with family/visitor present     Time: 1202-1216 OT Time Calculation (min): 14 min  Charges: OT General Charges $OT Visit: 1 Procedure OT Treatments $Self Care/Home Management : 8-22 mins  Henry Utsey , MS, OTR/L, CLT Pager: [phone]  03/19/2014, 12:25 PM

## 2014-03-19 NOTE — Progress Notes (Signed)
Talked to patient with daughter Lupita LeashDonna present [facility]; pt/ daughter chose [facility]; Miranda with [facility] called for arrangements; Lupita LeashDonna will be the contact person - tel # [phone] / [phone]; Abelino DerrickB Bani Gianfrancesco RN,BSN,MHA [phone]

## 2014-03-19 NOTE — Discharge Instructions (Signed)
Do not take coumadin 2-22.  A nurse will check INR on 2-23, PCP will recommend further doses.

## 2014-03-19 NOTE — Progress Notes (Signed)
D/C orders received. Pt and daughter educated on d/c instructions. Verbalized understanding. Pt handed d/c packet and prescriptions. IV and tele removed. Pt taken downstairs by staff via wheelchair.

## 2014-03-20 ENCOUNTER — Telehealth: Payer: Self-pay | Admitting: *Deleted

## 2014-03-20 DIAGNOSIS — R410 Disorientation, unspecified: Secondary | ICD-10-CM | POA: Diagnosis not present

## 2014-03-20 DIAGNOSIS — H3530 Unspecified macular degeneration: Secondary | ICD-10-CM | POA: Diagnosis not present

## 2014-03-20 DIAGNOSIS — W19XXXD Unspecified fall, subsequent encounter: Secondary | ICD-10-CM | POA: Diagnosis not present

## 2014-03-20 DIAGNOSIS — F0391 Unspecified dementia with behavioral disturbance: Secondary | ICD-10-CM | POA: Diagnosis not present

## 2014-03-20 DIAGNOSIS — I6931 Cognitive deficits following cerebral infarction: Secondary | ICD-10-CM | POA: Diagnosis not present

## 2014-03-20 DIAGNOSIS — Z8673 Personal history of transient ischemic attack (TIA), and cerebral infarction without residual deficits: Secondary | ICD-10-CM | POA: Diagnosis not present

## 2014-03-20 DIAGNOSIS — Z7901 Long term (current) use of anticoagulants: Secondary | ICD-10-CM | POA: Diagnosis not present

## 2014-03-20 DIAGNOSIS — H9190 Unspecified hearing loss, unspecified ear: Secondary | ICD-10-CM | POA: Diagnosis not present

## 2014-03-20 DIAGNOSIS — I4891 Unspecified atrial fibrillation: Secondary | ICD-10-CM | POA: Diagnosis not present

## 2014-03-20 NOTE — Telephone Encounter (Signed)
Called patient and spoke with her daughter Laura Diaz, explained to her that discharge instructions were incorrect and that the patient should follow up in 2 months instead of 2 weeks. Patient was rescheduled with Dr Roda ShuttersXu on 05/18/14 at 9:30 am. Informed patient that appointment with Dr Lucia GaskinsAhern has been cancelled.

## 2014-03-21 DIAGNOSIS — J309 Allergic rhinitis, unspecified: Secondary | ICD-10-CM | POA: Diagnosis not present

## 2014-03-21 DIAGNOSIS — Z79899 Other long term (current) drug therapy: Secondary | ICD-10-CM | POA: Diagnosis not present

## 2014-03-21 DIAGNOSIS — G479 Sleep disorder, unspecified: Secondary | ICD-10-CM | POA: Diagnosis not present

## 2014-03-21 DIAGNOSIS — R2689 Other abnormalities of gait and mobility: Secondary | ICD-10-CM | POA: Diagnosis not present

## 2014-03-21 DIAGNOSIS — Z9181 History of falling: Secondary | ICD-10-CM | POA: Diagnosis not present

## 2014-03-21 DIAGNOSIS — Z7901 Long term (current) use of anticoagulants: Secondary | ICD-10-CM | POA: Diagnosis not present

## 2014-03-21 DIAGNOSIS — G3184 Mild cognitive impairment, so stated: Secondary | ICD-10-CM | POA: Diagnosis not present

## 2014-03-21 DIAGNOSIS — H9190 Unspecified hearing loss, unspecified ear: Secondary | ICD-10-CM | POA: Diagnosis not present

## 2014-03-21 DIAGNOSIS — F05 Delirium due to known physiological condition: Secondary | ICD-10-CM | POA: Diagnosis not present

## 2014-03-21 DIAGNOSIS — F419 Anxiety disorder, unspecified: Secondary | ICD-10-CM | POA: Diagnosis not present

## 2014-03-21 DIAGNOSIS — Z9189 Other specified personal risk factors, not elsewhere classified: Secondary | ICD-10-CM | POA: Diagnosis not present

## 2014-03-21 DIAGNOSIS — I639 Cerebral infarction, unspecified: Secondary | ICD-10-CM | POA: Diagnosis not present

## 2014-03-22 DIAGNOSIS — F0391 Unspecified dementia with behavioral disturbance: Secondary | ICD-10-CM | POA: Diagnosis not present

## 2014-03-22 DIAGNOSIS — R410 Disorientation, unspecified: Secondary | ICD-10-CM | POA: Diagnosis not present

## 2014-03-22 DIAGNOSIS — Z7901 Long term (current) use of anticoagulants: Secondary | ICD-10-CM | POA: Diagnosis not present

## 2014-03-22 DIAGNOSIS — I6931 Cognitive deficits following cerebral infarction: Secondary | ICD-10-CM | POA: Diagnosis not present

## 2014-03-22 DIAGNOSIS — I4891 Unspecified atrial fibrillation: Secondary | ICD-10-CM | POA: Diagnosis not present

## 2014-03-22 DIAGNOSIS — Z8673 Personal history of transient ischemic attack (TIA), and cerebral infarction without residual deficits: Secondary | ICD-10-CM | POA: Diagnosis not present

## 2014-03-23 DIAGNOSIS — Z7901 Long term (current) use of anticoagulants: Secondary | ICD-10-CM | POA: Diagnosis not present

## 2014-03-23 DIAGNOSIS — I4891 Unspecified atrial fibrillation: Secondary | ICD-10-CM | POA: Diagnosis not present

## 2014-03-23 DIAGNOSIS — F0391 Unspecified dementia with behavioral disturbance: Secondary | ICD-10-CM | POA: Diagnosis not present

## 2014-03-23 DIAGNOSIS — Z8673 Personal history of transient ischemic attack (TIA), and cerebral infarction without residual deficits: Secondary | ICD-10-CM | POA: Diagnosis not present

## 2014-03-23 DIAGNOSIS — R410 Disorientation, unspecified: Secondary | ICD-10-CM | POA: Diagnosis not present

## 2014-03-23 DIAGNOSIS — I6931 Cognitive deficits following cerebral infarction: Secondary | ICD-10-CM | POA: Diagnosis not present

## 2014-03-27 DIAGNOSIS — F0391 Unspecified dementia with behavioral disturbance: Secondary | ICD-10-CM | POA: Diagnosis not present

## 2014-03-27 DIAGNOSIS — I4891 Unspecified atrial fibrillation: Secondary | ICD-10-CM | POA: Diagnosis not present

## 2014-03-27 DIAGNOSIS — Z7901 Long term (current) use of anticoagulants: Secondary | ICD-10-CM | POA: Diagnosis not present

## 2014-03-27 DIAGNOSIS — R410 Disorientation, unspecified: Secondary | ICD-10-CM | POA: Diagnosis not present

## 2014-03-27 DIAGNOSIS — I6931 Cognitive deficits following cerebral infarction: Secondary | ICD-10-CM | POA: Diagnosis not present

## 2014-03-27 DIAGNOSIS — L720 Epidermal cyst: Secondary | ICD-10-CM | POA: Diagnosis not present

## 2014-03-27 DIAGNOSIS — L821 Other seborrheic keratosis: Secondary | ICD-10-CM | POA: Diagnosis not present

## 2014-03-27 DIAGNOSIS — Z85828 Personal history of other malignant neoplasm of skin: Secondary | ICD-10-CM | POA: Diagnosis not present

## 2014-03-27 DIAGNOSIS — Z8673 Personal history of transient ischemic attack (TIA), and cerebral infarction without residual deficits: Secondary | ICD-10-CM | POA: Diagnosis not present

## 2014-03-28 DIAGNOSIS — Z8673 Personal history of transient ischemic attack (TIA), and cerebral infarction without residual deficits: Secondary | ICD-10-CM | POA: Diagnosis not present

## 2014-03-28 DIAGNOSIS — Z7901 Long term (current) use of anticoagulants: Secondary | ICD-10-CM | POA: Diagnosis not present

## 2014-03-28 DIAGNOSIS — I4891 Unspecified atrial fibrillation: Secondary | ICD-10-CM | POA: Diagnosis not present

## 2014-03-28 DIAGNOSIS — F0391 Unspecified dementia with behavioral disturbance: Secondary | ICD-10-CM | POA: Diagnosis not present

## 2014-03-28 DIAGNOSIS — I6931 Cognitive deficits following cerebral infarction: Secondary | ICD-10-CM | POA: Diagnosis not present

## 2014-03-28 DIAGNOSIS — R410 Disorientation, unspecified: Secondary | ICD-10-CM | POA: Diagnosis not present

## 2014-03-29 DIAGNOSIS — I4891 Unspecified atrial fibrillation: Secondary | ICD-10-CM | POA: Diagnosis not present

## 2014-03-29 DIAGNOSIS — F0391 Unspecified dementia with behavioral disturbance: Secondary | ICD-10-CM | POA: Diagnosis not present

## 2014-03-29 DIAGNOSIS — Z8673 Personal history of transient ischemic attack (TIA), and cerebral infarction without residual deficits: Secondary | ICD-10-CM | POA: Diagnosis not present

## 2014-03-29 DIAGNOSIS — R410 Disorientation, unspecified: Secondary | ICD-10-CM | POA: Diagnosis not present

## 2014-03-29 DIAGNOSIS — Z7901 Long term (current) use of anticoagulants: Secondary | ICD-10-CM | POA: Diagnosis not present

## 2014-03-29 DIAGNOSIS — I6931 Cognitive deficits following cerebral infarction: Secondary | ICD-10-CM | POA: Diagnosis not present

## 2014-03-30 DIAGNOSIS — H43813 Vitreous degeneration, bilateral: Secondary | ICD-10-CM | POA: Diagnosis not present

## 2014-03-30 DIAGNOSIS — H3532 Exudative age-related macular degeneration: Secondary | ICD-10-CM | POA: Diagnosis not present

## 2014-04-02 DIAGNOSIS — Z8673 Personal history of transient ischemic attack (TIA), and cerebral infarction without residual deficits: Secondary | ICD-10-CM | POA: Diagnosis not present

## 2014-04-02 DIAGNOSIS — Z7901 Long term (current) use of anticoagulants: Secondary | ICD-10-CM | POA: Diagnosis not present

## 2014-04-02 DIAGNOSIS — R410 Disorientation, unspecified: Secondary | ICD-10-CM | POA: Diagnosis not present

## 2014-04-02 DIAGNOSIS — I4891 Unspecified atrial fibrillation: Secondary | ICD-10-CM | POA: Diagnosis not present

## 2014-04-02 DIAGNOSIS — I6931 Cognitive deficits following cerebral infarction: Secondary | ICD-10-CM | POA: Diagnosis not present

## 2014-04-02 DIAGNOSIS — F0391 Unspecified dementia with behavioral disturbance: Secondary | ICD-10-CM | POA: Diagnosis not present

## 2014-04-05 ENCOUNTER — Ambulatory Visit: Payer: Self-pay | Admitting: Neurology

## 2014-04-05 DIAGNOSIS — F0391 Unspecified dementia with behavioral disturbance: Secondary | ICD-10-CM | POA: Diagnosis not present

## 2014-04-05 DIAGNOSIS — R410 Disorientation, unspecified: Secondary | ICD-10-CM | POA: Diagnosis not present

## 2014-04-05 DIAGNOSIS — Z8673 Personal history of transient ischemic attack (TIA), and cerebral infarction without residual deficits: Secondary | ICD-10-CM | POA: Diagnosis not present

## 2014-04-05 DIAGNOSIS — I4891 Unspecified atrial fibrillation: Secondary | ICD-10-CM | POA: Diagnosis not present

## 2014-04-05 DIAGNOSIS — Z7901 Long term (current) use of anticoagulants: Secondary | ICD-10-CM | POA: Diagnosis not present

## 2014-04-05 DIAGNOSIS — I6931 Cognitive deficits following cerebral infarction: Secondary | ICD-10-CM | POA: Diagnosis not present

## 2014-04-06 DIAGNOSIS — H6123 Impacted cerumen, bilateral: Secondary | ICD-10-CM | POA: Diagnosis not present

## 2014-04-06 DIAGNOSIS — J3089 Other allergic rhinitis: Secondary | ICD-10-CM | POA: Diagnosis not present

## 2014-04-09 DIAGNOSIS — H3532 Exudative age-related macular degeneration: Secondary | ICD-10-CM | POA: Diagnosis not present

## 2014-04-10 DIAGNOSIS — R410 Disorientation, unspecified: Secondary | ICD-10-CM | POA: Diagnosis not present

## 2014-04-10 DIAGNOSIS — I6931 Cognitive deficits following cerebral infarction: Secondary | ICD-10-CM | POA: Diagnosis not present

## 2014-04-10 DIAGNOSIS — F0391 Unspecified dementia with behavioral disturbance: Secondary | ICD-10-CM | POA: Diagnosis not present

## 2014-04-10 DIAGNOSIS — Z7901 Long term (current) use of anticoagulants: Secondary | ICD-10-CM | POA: Diagnosis not present

## 2014-04-10 DIAGNOSIS — I4891 Unspecified atrial fibrillation: Secondary | ICD-10-CM | POA: Diagnosis not present

## 2014-04-10 DIAGNOSIS — Z8673 Personal history of transient ischemic attack (TIA), and cerebral infarction without residual deficits: Secondary | ICD-10-CM | POA: Diagnosis not present

## 2014-04-12 DIAGNOSIS — R410 Disorientation, unspecified: Secondary | ICD-10-CM | POA: Diagnosis not present

## 2014-04-12 DIAGNOSIS — I4891 Unspecified atrial fibrillation: Secondary | ICD-10-CM | POA: Diagnosis not present

## 2014-04-12 DIAGNOSIS — F0391 Unspecified dementia with behavioral disturbance: Secondary | ICD-10-CM | POA: Diagnosis not present

## 2014-04-12 DIAGNOSIS — Z8673 Personal history of transient ischemic attack (TIA), and cerebral infarction without residual deficits: Secondary | ICD-10-CM | POA: Diagnosis not present

## 2014-04-12 DIAGNOSIS — I6931 Cognitive deficits following cerebral infarction: Secondary | ICD-10-CM | POA: Diagnosis not present

## 2014-04-12 DIAGNOSIS — Z7901 Long term (current) use of anticoagulants: Secondary | ICD-10-CM | POA: Diagnosis not present

## 2014-04-16 DIAGNOSIS — Z8673 Personal history of transient ischemic attack (TIA), and cerebral infarction without residual deficits: Secondary | ICD-10-CM | POA: Diagnosis not present

## 2014-04-16 DIAGNOSIS — F0391 Unspecified dementia with behavioral disturbance: Secondary | ICD-10-CM | POA: Diagnosis not present

## 2014-04-16 DIAGNOSIS — I4891 Unspecified atrial fibrillation: Secondary | ICD-10-CM | POA: Diagnosis not present

## 2014-04-16 DIAGNOSIS — Z7901 Long term (current) use of anticoagulants: Secondary | ICD-10-CM | POA: Diagnosis not present

## 2014-04-16 DIAGNOSIS — I6931 Cognitive deficits following cerebral infarction: Secondary | ICD-10-CM | POA: Diagnosis not present

## 2014-04-16 DIAGNOSIS — R410 Disorientation, unspecified: Secondary | ICD-10-CM | POA: Diagnosis not present

## 2014-04-17 DIAGNOSIS — F0391 Unspecified dementia with behavioral disturbance: Secondary | ICD-10-CM | POA: Diagnosis not present

## 2014-04-17 DIAGNOSIS — R410 Disorientation, unspecified: Secondary | ICD-10-CM | POA: Diagnosis not present

## 2014-04-17 DIAGNOSIS — Z8673 Personal history of transient ischemic attack (TIA), and cerebral infarction without residual deficits: Secondary | ICD-10-CM | POA: Diagnosis not present

## 2014-04-17 DIAGNOSIS — I4891 Unspecified atrial fibrillation: Secondary | ICD-10-CM | POA: Diagnosis not present

## 2014-04-17 DIAGNOSIS — I6931 Cognitive deficits following cerebral infarction: Secondary | ICD-10-CM | POA: Diagnosis not present

## 2014-04-17 DIAGNOSIS — Z7901 Long term (current) use of anticoagulants: Secondary | ICD-10-CM | POA: Diagnosis not present

## 2014-04-18 DIAGNOSIS — Z8673 Personal history of transient ischemic attack (TIA), and cerebral infarction without residual deficits: Secondary | ICD-10-CM | POA: Diagnosis not present

## 2014-04-18 DIAGNOSIS — I6931 Cognitive deficits following cerebral infarction: Secondary | ICD-10-CM | POA: Diagnosis not present

## 2014-04-18 DIAGNOSIS — Z7901 Long term (current) use of anticoagulants: Secondary | ICD-10-CM | POA: Diagnosis not present

## 2014-04-18 DIAGNOSIS — F0391 Unspecified dementia with behavioral disturbance: Secondary | ICD-10-CM | POA: Diagnosis not present

## 2014-04-18 DIAGNOSIS — I4891 Unspecified atrial fibrillation: Secondary | ICD-10-CM | POA: Diagnosis not present

## 2014-04-18 DIAGNOSIS — R410 Disorientation, unspecified: Secondary | ICD-10-CM | POA: Diagnosis not present

## 2014-04-19 DIAGNOSIS — R109 Unspecified abdominal pain: Secondary | ICD-10-CM | POA: Diagnosis not present

## 2014-04-26 DIAGNOSIS — F0391 Unspecified dementia with behavioral disturbance: Secondary | ICD-10-CM | POA: Diagnosis not present

## 2014-04-26 DIAGNOSIS — I6931 Cognitive deficits following cerebral infarction: Secondary | ICD-10-CM | POA: Diagnosis not present

## 2014-04-26 DIAGNOSIS — I4891 Unspecified atrial fibrillation: Secondary | ICD-10-CM | POA: Diagnosis not present

## 2014-04-26 DIAGNOSIS — Z7901 Long term (current) use of anticoagulants: Secondary | ICD-10-CM | POA: Diagnosis not present

## 2014-04-26 DIAGNOSIS — Z8673 Personal history of transient ischemic attack (TIA), and cerebral infarction without residual deficits: Secondary | ICD-10-CM | POA: Diagnosis not present

## 2014-04-26 DIAGNOSIS — R410 Disorientation, unspecified: Secondary | ICD-10-CM | POA: Diagnosis not present

## 2014-04-30 DIAGNOSIS — R822 Biliuria: Secondary | ICD-10-CM | POA: Diagnosis not present

## 2014-04-30 DIAGNOSIS — G3184 Mild cognitive impairment, so stated: Secondary | ICD-10-CM | POA: Diagnosis not present

## 2014-05-01 DIAGNOSIS — Z8673 Personal history of transient ischemic attack (TIA), and cerebral infarction without residual deficits: Secondary | ICD-10-CM | POA: Diagnosis not present

## 2014-05-01 DIAGNOSIS — I4891 Unspecified atrial fibrillation: Secondary | ICD-10-CM | POA: Diagnosis not present

## 2014-05-01 DIAGNOSIS — Z7901 Long term (current) use of anticoagulants: Secondary | ICD-10-CM | POA: Diagnosis not present

## 2014-05-01 DIAGNOSIS — I6931 Cognitive deficits following cerebral infarction: Secondary | ICD-10-CM | POA: Diagnosis not present

## 2014-05-01 DIAGNOSIS — R410 Disorientation, unspecified: Secondary | ICD-10-CM | POA: Diagnosis not present

## 2014-05-01 DIAGNOSIS — F0391 Unspecified dementia with behavioral disturbance: Secondary | ICD-10-CM | POA: Diagnosis not present

## 2014-05-07 DIAGNOSIS — H43813 Vitreous degeneration, bilateral: Secondary | ICD-10-CM | POA: Diagnosis not present

## 2014-05-07 DIAGNOSIS — H3532 Exudative age-related macular degeneration: Secondary | ICD-10-CM | POA: Diagnosis not present

## 2014-05-08 DIAGNOSIS — Z8673 Personal history of transient ischemic attack (TIA), and cerebral infarction without residual deficits: Secondary | ICD-10-CM | POA: Diagnosis not present

## 2014-05-08 DIAGNOSIS — I6931 Cognitive deficits following cerebral infarction: Secondary | ICD-10-CM | POA: Diagnosis not present

## 2014-05-08 DIAGNOSIS — R410 Disorientation, unspecified: Secondary | ICD-10-CM | POA: Diagnosis not present

## 2014-05-08 DIAGNOSIS — I4891 Unspecified atrial fibrillation: Secondary | ICD-10-CM | POA: Diagnosis not present

## 2014-05-08 DIAGNOSIS — F0391 Unspecified dementia with behavioral disturbance: Secondary | ICD-10-CM | POA: Diagnosis not present

## 2014-05-08 DIAGNOSIS — Z7901 Long term (current) use of anticoagulants: Secondary | ICD-10-CM | POA: Diagnosis not present

## 2014-05-16 DIAGNOSIS — I4891 Unspecified atrial fibrillation: Secondary | ICD-10-CM | POA: Diagnosis not present

## 2014-05-16 DIAGNOSIS — Z7901 Long term (current) use of anticoagulants: Secondary | ICD-10-CM | POA: Diagnosis not present

## 2014-05-16 DIAGNOSIS — F0391 Unspecified dementia with behavioral disturbance: Secondary | ICD-10-CM | POA: Diagnosis not present

## 2014-05-16 DIAGNOSIS — Z8673 Personal history of transient ischemic attack (TIA), and cerebral infarction without residual deficits: Secondary | ICD-10-CM | POA: Diagnosis not present

## 2014-05-16 DIAGNOSIS — I6931 Cognitive deficits following cerebral infarction: Secondary | ICD-10-CM | POA: Diagnosis not present

## 2014-05-16 DIAGNOSIS — R410 Disorientation, unspecified: Secondary | ICD-10-CM | POA: Diagnosis not present

## 2014-05-18 ENCOUNTER — Encounter: Payer: Self-pay | Admitting: Neurology

## 2014-05-18 ENCOUNTER — Ambulatory Visit (INDEPENDENT_AMBULATORY_CARE_PROVIDER_SITE_OTHER): Payer: Medicare Other | Admitting: Neurology

## 2014-05-18 VITALS — BP 149/66 | HR 49 | Ht 63.0 in | Wt 184.0 lb

## 2014-05-18 DIAGNOSIS — G9340 Encephalopathy, unspecified: Secondary | ICD-10-CM | POA: Insufficient documentation

## 2014-05-18 DIAGNOSIS — I639 Cerebral infarction, unspecified: Secondary | ICD-10-CM

## 2014-05-18 DIAGNOSIS — Z7901 Long term (current) use of anticoagulants: Secondary | ICD-10-CM

## 2014-05-18 DIAGNOSIS — H3530 Unspecified macular degeneration: Secondary | ICD-10-CM | POA: Diagnosis not present

## 2014-05-18 NOTE — Patient Instructions (Signed)
-   continue coumadin and lipitor for stroke prevention - Follow up with your primary care physician for stroke risk factor modification. Recommend maintain blood pressure goal <130/80, diabetes with hemoglobin A1c goal below 6.5% and lipids with LDL cholesterol goal below 70 mg/dL.  - recommend not to drive as Dr. Zella BallSander's recommended due to macular degeneration. - check BP at home and ask PCP for a BP monitor device. - walk with walker and avoid fall. - follow up in 6 months.

## 2014-05-18 NOTE — Progress Notes (Signed)
STROKE NEUROLOGY FOLLOW UP NOTE  NAME: Laura KillingsClara Marie Diaz DOB: 15-Jul-1922  REASON FOR VISIT: stroke follow up HISTORY FROM: daughter and chart  Today we had the pleasure of seeing Laura Diaz in follow-up at our Neurology Clinic. Pt was accompanied by daughter.   History Summary 81 year old female with history of chronic anticoagulation presumed due to TIA 20 years ago, mild dementia, macular degeneration was admitted on 03/16/14 for AMS. As per daughter, the cause for AMS was likely due to taking extra ativan and pain medications prescribed for her husband. Negative UTI. However, MRI found right parietal white matter punctate acute infarct, which is not able to explain patient AMS. She takes Coumadin home and INR 2.08 on admission. Patient back to baseline during admission. She was discharged on Coumadin. LDL was high, put on lipitor for stroke prevention.  Interval History During the interval time, the patient has been doing well. No recurrent symptoms, no acute issues. Patient lives alone with home health nurse and nursing aide. Daughter is taking care of her medication. She walks with walker, no falls. She follows up with ophthalmologist for injection of macular degeneration, recommend not driving due to vision changes. She still has very bad hard of hearing. She is only on atorvastatin and warfarin as well as tramadol right now. Ativan was discontinued. She has appointment on 05/30/2014 to see her PCP Dr. Valentina LucksGriffin.  REVIEW OF SYSTEMS: Full 14 system review of systems performed and notable only for those listed below and in HPI above, all others are negative:  Constitutional:   Cardiovascular:  Ear/Nose/Throat:  Hearing loss Skin:  Eyes:   Respiratory:   Gastroitestinal:  Incontinence Genitourinary:  Hematology/Lymphatic:   Endocrine:  Musculoskeletal:   Allergy/Immunology:  Allergies, runny nose Neurological:  Memory loss, confusion Psychiatric: Depression, anxiety Sleep:    The following represents the patient's updated allergies and side effects list: No Known Allergies  The neurologically relevant items on the patient's problem list were reviewed on today's visit.  Neurologic Examination  A problem focused neurological exam (12 or more points of the single system neurologic examination, vital signs counts as 1 point, cranial nerves count for 8 points) was performed.  Blood pressure 149/66, pulse 49, height 5\' 3"  (1.6 m), weight 184 lb (83.462 kg).  General - obese, well developed, in no apparent distress.  Ophthalmologic - fundi not visualized due to incorporation.  Cardiovascular - Regular rhythm, but bradycardia.  Mental Status -  Level of arousal and orientation to time, place, and person were intact. Language including expression, naming, repetition, comprehension was assessed and found intact. Fund of Knowledge was assessed and was intact.  Cranial Nerves II - XII - II - Visual field intact OU. III, IV, VI - Extraocular movements intact. V - Facial sensation intact bilaterally. VII - Facial movement intact bilaterally. VIII - severely decreased hearing bilaterally & vestibular intact bilaterally. X - Palate elevates symmetrically. XI - Chin turning & shoulder shrug intact bilaterally. XII - Tongue protrusion intact.  Motor Strength - The patient's strength was 4/5 in all extremities and pronator drift was absent.  Bulk was normal and fasciculations were absent.   Motor Tone - Muscle tone was assessed at the neck and appendages and was normal.  Reflexes - The patient's reflexes were 1+ in all extremities and she had no pathological reflexes.  Sensory - Light touch, temperature/pinprick were assessed and were normal.    Coordination - The patient had normal movements in the hands with no ataxia  or dysmetria.  Tremor was absent.  Gait and Station - walk with walker, steady.  Data reviewed: I personally reviewed the images and agree  with the radiology interpretations.  X-ray Chest Pa And Lateral 03/17/2014  No active cardiopulmonary disease.   Ct Head Wo Contrast 03/16/2014  Atrophy with small vessel chronic ischemic changes of deep cerebral white matter. No acute intracranial abnormalities.   Mr Brain Wo Contrast 03/17/2014  5 mm acute infarct right posterior periventricular white matter. Atrophy and moderate chronic microvascular ischemia.   CUS - Bilateral: 1-39% ICA stenosis. Vertebral artery flow is antegrade.  2D echo - - Left ventricle: The cavity size was normal. Wall thickness was increased in a pattern of mild LVH. Systolic function was normal. The estimated ejection fraction was in the range of 55% to 60%. Wall motion was normal; there were no regional wall motion abnormalities. Doppler parameters are consistent with abnormal left ventricular relaxation (grade 1 diastolic dysfunction). Impressions: - No cardiac source of emboli was indentified.  Component     Latest Ref Rng 03/18/2014  Cholesterol     0 - 200 mg/dL 409 (H)  Triglycerides     <150 mg/dL 811  HDL Cholesterol     >39 mg/dL 36 (L)  Total CHOL/HDL Ratio      5.8  VLDL     0 - 40 mg/dL 24  LDL (calc)     0 - 99 mg/dL 914 (H)  Hemoglobin N8G     4.8 - 5.6 % 6.0 (H)  Mean Plasma Glucose      126    Assessment: As you may recall, she is a 81 y.o. Caucasian female with PMH of chronic anticoagulation presumed due to TIA 20 years ago, mild dementia, macular degeneration was admitted on 03/16/2014 for altered mental status, which is most like due to benzo and pain medication overdose at home. However, MRI showed punctate infarct right posterior periventricular white matter. She is taking Coumadin at home and INR 2.08 on admission. Her symptoms quickly resolved, and her Coumadin was continued. During the interval time, she was doing well.  Plan:  - continue coumadin and lipitor for stroke prevention. INR goal  2-3. - Follow up with primary care physician for stroke risk factor modification. Recommend maintain blood pressure goal <130/80, diabetes with hemoglobin A1c goal below 6.5% and lipids with LDL cholesterol goal below 70 mg/dL.  - recommend not to drive as Dr. Zella Ball recommended due to macular degeneration. - check BP at home. - walk with walker and avoid fall. - RTC in 6 months.  No orders of the defined types were placed in this encounter.    Meds ordered this encounter  Medications  . traZODone (DESYREL) 50 MG tablet    Sig: Take 50 mg by mouth at bedtime.    Patient Instructions  - continue coumadin and lipitor for stroke prevention - Follow up with your primary care physician for stroke risk factor modification. Recommend maintain blood pressure goal <130/80, diabetes with hemoglobin A1c goal below 6.5% and lipids with LDL cholesterol goal below 70 mg/dL.  - recommend not to drive as Dr. Zella Ball recommended due to macular degeneration. - check BP at home and ask PCP for a BP monitor device. - walk with walker and avoid fall. - follow up in 6 months.   Marvel Plan, MD PhD [facility] [address], [address] [place], [place] [address] [phone]

## 2014-05-31 DIAGNOSIS — Z7901 Long term (current) use of anticoagulants: Secondary | ICD-10-CM | POA: Diagnosis not present

## 2014-05-31 DIAGNOSIS — E780 Pure hypercholesterolemia: Secondary | ICD-10-CM | POA: Diagnosis not present

## 2014-05-31 DIAGNOSIS — F325 Major depressive disorder, single episode, in full remission: Secondary | ICD-10-CM | POA: Diagnosis not present

## 2014-05-31 DIAGNOSIS — G4700 Insomnia, unspecified: Secondary | ICD-10-CM | POA: Diagnosis not present

## 2014-05-31 DIAGNOSIS — G3184 Mild cognitive impairment, so stated: Secondary | ICD-10-CM | POA: Diagnosis not present

## 2014-05-31 DIAGNOSIS — Z8673 Personal history of transient ischemic attack (TIA), and cerebral infarction without residual deficits: Secondary | ICD-10-CM | POA: Diagnosis not present

## 2014-06-04 DIAGNOSIS — H43813 Vitreous degeneration, bilateral: Secondary | ICD-10-CM | POA: Diagnosis not present

## 2014-06-04 DIAGNOSIS — H3532 Exudative age-related macular degeneration: Secondary | ICD-10-CM | POA: Diagnosis not present

## 2014-07-10 DIAGNOSIS — H3532 Exudative age-related macular degeneration: Secondary | ICD-10-CM | POA: Diagnosis not present

## 2014-07-13 DIAGNOSIS — Z7901 Long term (current) use of anticoagulants: Secondary | ICD-10-CM | POA: Diagnosis not present

## 2014-08-03 DIAGNOSIS — Z7901 Long term (current) use of anticoagulants: Secondary | ICD-10-CM | POA: Diagnosis not present

## 2014-08-14 DIAGNOSIS — H3532 Exudative age-related macular degeneration: Secondary | ICD-10-CM | POA: Diagnosis not present

## 2014-08-14 DIAGNOSIS — H43813 Vitreous degeneration, bilateral: Secondary | ICD-10-CM | POA: Diagnosis not present

## 2014-08-31 DIAGNOSIS — Z7901 Long term (current) use of anticoagulants: Secondary | ICD-10-CM | POA: Diagnosis not present

## 2014-09-25 DIAGNOSIS — H3532 Exudative age-related macular degeneration: Secondary | ICD-10-CM | POA: Diagnosis not present

## 2014-09-28 DIAGNOSIS — Z7901 Long term (current) use of anticoagulants: Secondary | ICD-10-CM | POA: Diagnosis not present

## 2014-10-26 DIAGNOSIS — Z7901 Long term (current) use of anticoagulants: Secondary | ICD-10-CM | POA: Diagnosis not present

## 2014-11-07 DIAGNOSIS — H353231 Exudative age-related macular degeneration, bilateral, with active choroidal neovascularization: Secondary | ICD-10-CM | POA: Diagnosis not present

## 2014-11-07 DIAGNOSIS — H43813 Vitreous degeneration, bilateral: Secondary | ICD-10-CM | POA: Diagnosis not present

## 2014-11-09 DIAGNOSIS — Z23 Encounter for immunization: Secondary | ICD-10-CM | POA: Diagnosis not present

## 2014-11-23 DIAGNOSIS — Z7901 Long term (current) use of anticoagulants: Secondary | ICD-10-CM | POA: Diagnosis not present

## 2014-11-29 ENCOUNTER — Ambulatory Visit: Payer: Medicare Other | Admitting: Neurology

## 2014-12-17 DIAGNOSIS — G2581 Restless legs syndrome: Secondary | ICD-10-CM | POA: Diagnosis not present

## 2014-12-17 DIAGNOSIS — Z23 Encounter for immunization: Secondary | ICD-10-CM | POA: Diagnosis not present

## 2014-12-17 DIAGNOSIS — M8588 Other specified disorders of bone density and structure, other site: Secondary | ICD-10-CM | POA: Diagnosis not present

## 2014-12-17 DIAGNOSIS — Z1389 Encounter for screening for other disorder: Secondary | ICD-10-CM | POA: Diagnosis not present

## 2014-12-17 DIAGNOSIS — E7800 Pure hypercholesterolemia, unspecified: Secondary | ICD-10-CM | POA: Diagnosis not present

## 2014-12-17 DIAGNOSIS — Z0000 Encounter for general adult medical examination without abnormal findings: Secondary | ICD-10-CM | POA: Diagnosis not present

## 2014-12-17 DIAGNOSIS — N183 Chronic kidney disease, stage 3 (moderate): Secondary | ICD-10-CM | POA: Diagnosis not present

## 2014-12-17 DIAGNOSIS — Z7901 Long term (current) use of anticoagulants: Secondary | ICD-10-CM | POA: Diagnosis not present

## 2015-01-01 DIAGNOSIS — H353231 Exudative age-related macular degeneration, bilateral, with active choroidal neovascularization: Secondary | ICD-10-CM | POA: Diagnosis not present

## 2015-01-11 DIAGNOSIS — Z7901 Long term (current) use of anticoagulants: Secondary | ICD-10-CM | POA: Diagnosis not present

## 2015-02-08 DIAGNOSIS — Z7901 Long term (current) use of anticoagulants: Secondary | ICD-10-CM | POA: Diagnosis not present

## 2015-02-27 DIAGNOSIS — H43813 Vitreous degeneration, bilateral: Secondary | ICD-10-CM | POA: Diagnosis not present

## 2015-02-27 DIAGNOSIS — H353211 Exudative age-related macular degeneration, right eye, with active choroidal neovascularization: Secondary | ICD-10-CM | POA: Diagnosis not present

## 2015-02-27 DIAGNOSIS — H353222 Exudative age-related macular degeneration, left eye, with inactive choroidal neovascularization: Secondary | ICD-10-CM | POA: Diagnosis not present

## 2015-03-08 DIAGNOSIS — Z7901 Long term (current) use of anticoagulants: Secondary | ICD-10-CM | POA: Diagnosis not present

## 2015-04-05 DIAGNOSIS — Z7901 Long term (current) use of anticoagulants: Secondary | ICD-10-CM | POA: Diagnosis not present

## 2015-05-01 DIAGNOSIS — H353211 Exudative age-related macular degeneration, right eye, with active choroidal neovascularization: Secondary | ICD-10-CM | POA: Diagnosis not present

## 2015-05-01 DIAGNOSIS — H353222 Exudative age-related macular degeneration, left eye, with inactive choroidal neovascularization: Secondary | ICD-10-CM | POA: Diagnosis not present

## 2015-05-01 DIAGNOSIS — H43813 Vitreous degeneration, bilateral: Secondary | ICD-10-CM | POA: Diagnosis not present

## 2015-05-03 DIAGNOSIS — Z7901 Long term (current) use of anticoagulants: Secondary | ICD-10-CM | POA: Diagnosis not present

## 2015-05-31 DIAGNOSIS — Z7901 Long term (current) use of anticoagulants: Secondary | ICD-10-CM | POA: Diagnosis not present

## 2015-06-25 DIAGNOSIS — L720 Epidermal cyst: Secondary | ICD-10-CM | POA: Diagnosis not present

## 2015-06-25 DIAGNOSIS — Z85828 Personal history of other malignant neoplasm of skin: Secondary | ICD-10-CM | POA: Diagnosis not present

## 2015-06-25 DIAGNOSIS — L723 Sebaceous cyst: Secondary | ICD-10-CM | POA: Diagnosis not present

## 2015-06-28 DIAGNOSIS — F325 Major depressive disorder, single episode, in full remission: Secondary | ICD-10-CM | POA: Diagnosis not present

## 2015-06-28 DIAGNOSIS — Z7901 Long term (current) use of anticoagulants: Secondary | ICD-10-CM | POA: Diagnosis not present

## 2015-06-28 DIAGNOSIS — Z5181 Encounter for therapeutic drug level monitoring: Secondary | ICD-10-CM | POA: Diagnosis not present

## 2015-06-28 DIAGNOSIS — G2581 Restless legs syndrome: Secondary | ICD-10-CM | POA: Diagnosis not present

## 2015-06-28 DIAGNOSIS — G4700 Insomnia, unspecified: Secondary | ICD-10-CM | POA: Diagnosis not present

## 2015-07-10 DIAGNOSIS — H43813 Vitreous degeneration, bilateral: Secondary | ICD-10-CM | POA: Diagnosis not present

## 2015-07-10 DIAGNOSIS — H353211 Exudative age-related macular degeneration, right eye, with active choroidal neovascularization: Secondary | ICD-10-CM | POA: Diagnosis not present

## 2015-07-10 DIAGNOSIS — H353222 Exudative age-related macular degeneration, left eye, with inactive choroidal neovascularization: Secondary | ICD-10-CM | POA: Diagnosis not present

## 2015-07-26 DIAGNOSIS — Z7901 Long term (current) use of anticoagulants: Secondary | ICD-10-CM | POA: Diagnosis not present

## 2015-08-16 DIAGNOSIS — Z7901 Long term (current) use of anticoagulants: Secondary | ICD-10-CM | POA: Diagnosis not present

## 2015-09-13 DIAGNOSIS — Z7901 Long term (current) use of anticoagulants: Secondary | ICD-10-CM | POA: Diagnosis not present

## 2015-10-02 DIAGNOSIS — H43813 Vitreous degeneration, bilateral: Secondary | ICD-10-CM | POA: Diagnosis not present

## 2015-10-02 DIAGNOSIS — H353222 Exudative age-related macular degeneration, left eye, with inactive choroidal neovascularization: Secondary | ICD-10-CM | POA: Diagnosis not present

## 2015-10-02 DIAGNOSIS — H353211 Exudative age-related macular degeneration, right eye, with active choroidal neovascularization: Secondary | ICD-10-CM | POA: Diagnosis not present

## 2015-10-11 DIAGNOSIS — Z7901 Long term (current) use of anticoagulants: Secondary | ICD-10-CM | POA: Diagnosis not present

## 2015-10-11 DIAGNOSIS — Z23 Encounter for immunization: Secondary | ICD-10-CM | POA: Diagnosis not present

## 2015-11-08 DIAGNOSIS — Z7901 Long term (current) use of anticoagulants: Secondary | ICD-10-CM | POA: Diagnosis not present

## 2015-12-06 DIAGNOSIS — Z7901 Long term (current) use of anticoagulants: Secondary | ICD-10-CM | POA: Diagnosis not present

## 2016-01-03 DIAGNOSIS — Z961 Presence of intraocular lens: Secondary | ICD-10-CM | POA: Diagnosis not present

## 2016-01-03 DIAGNOSIS — T86840 Corneal transplant rejection: Secondary | ICD-10-CM | POA: Diagnosis not present

## 2016-01-03 DIAGNOSIS — H5211 Myopia, right eye: Secondary | ICD-10-CM | POA: Diagnosis not present

## 2016-01-03 DIAGNOSIS — Z7901 Long term (current) use of anticoagulants: Secondary | ICD-10-CM | POA: Diagnosis not present

## 2016-01-03 DIAGNOSIS — H3530 Unspecified macular degeneration: Secondary | ICD-10-CM | POA: Diagnosis not present

## 2016-01-08 DIAGNOSIS — H353222 Exudative age-related macular degeneration, left eye, with inactive choroidal neovascularization: Secondary | ICD-10-CM | POA: Diagnosis not present

## 2016-01-08 DIAGNOSIS — H43813 Vitreous degeneration, bilateral: Secondary | ICD-10-CM | POA: Diagnosis not present

## 2016-01-08 DIAGNOSIS — H353211 Exudative age-related macular degeneration, right eye, with active choroidal neovascularization: Secondary | ICD-10-CM | POA: Diagnosis not present

## 2016-01-31 DIAGNOSIS — Z7901 Long term (current) use of anticoagulants: Secondary | ICD-10-CM | POA: Diagnosis not present

## 2016-02-07 DIAGNOSIS — Z7901 Long term (current) use of anticoagulants: Secondary | ICD-10-CM | POA: Diagnosis not present

## 2016-02-07 DIAGNOSIS — G4700 Insomnia, unspecified: Secondary | ICD-10-CM | POA: Diagnosis not present

## 2016-03-13 DIAGNOSIS — Z7901 Long term (current) use of anticoagulants: Secondary | ICD-10-CM | POA: Diagnosis not present

## 2016-03-27 DIAGNOSIS — G4700 Insomnia, unspecified: Secondary | ICD-10-CM | POA: Diagnosis not present

## 2016-03-27 DIAGNOSIS — Z7901 Long term (current) use of anticoagulants: Secondary | ICD-10-CM | POA: Diagnosis not present

## 2016-04-07 DIAGNOSIS — H353222 Exudative age-related macular degeneration, left eye, with inactive choroidal neovascularization: Secondary | ICD-10-CM | POA: Diagnosis not present

## 2016-04-07 DIAGNOSIS — H353211 Exudative age-related macular degeneration, right eye, with active choroidal neovascularization: Secondary | ICD-10-CM | POA: Diagnosis not present

## 2016-04-07 DIAGNOSIS — H43813 Vitreous degeneration, bilateral: Secondary | ICD-10-CM | POA: Diagnosis not present

## 2016-04-10 DIAGNOSIS — Z7901 Long term (current) use of anticoagulants: Secondary | ICD-10-CM | POA: Diagnosis not present

## 2016-05-08 DIAGNOSIS — Z7901 Long term (current) use of anticoagulants: Secondary | ICD-10-CM | POA: Diagnosis not present

## 2016-05-19 DIAGNOSIS — H353211 Exudative age-related macular degeneration, right eye, with active choroidal neovascularization: Secondary | ICD-10-CM | POA: Diagnosis not present

## 2016-05-19 DIAGNOSIS — H353124 Nonexudative age-related macular degeneration, left eye, advanced atrophic with subfoveal involvement: Secondary | ICD-10-CM | POA: Diagnosis not present

## 2016-05-19 DIAGNOSIS — H43813 Vitreous degeneration, bilateral: Secondary | ICD-10-CM | POA: Diagnosis not present

## 2016-05-29 DIAGNOSIS — Z7901 Long term (current) use of anticoagulants: Secondary | ICD-10-CM | POA: Diagnosis not present

## 2016-06-04 DIAGNOSIS — Z7901 Long term (current) use of anticoagulants: Secondary | ICD-10-CM | POA: Diagnosis not present

## 2016-06-30 DIAGNOSIS — H353211 Exudative age-related macular degeneration, right eye, with active choroidal neovascularization: Secondary | ICD-10-CM | POA: Diagnosis not present

## 2016-06-30 DIAGNOSIS — H43813 Vitreous degeneration, bilateral: Secondary | ICD-10-CM | POA: Diagnosis not present

## 2016-06-30 DIAGNOSIS — H353223 Exudative age-related macular degeneration, left eye, with inactive scar: Secondary | ICD-10-CM | POA: Diagnosis not present

## 2016-07-03 DIAGNOSIS — Z7901 Long term (current) use of anticoagulants: Secondary | ICD-10-CM | POA: Diagnosis not present

## 2016-07-31 DIAGNOSIS — Z7901 Long term (current) use of anticoagulants: Secondary | ICD-10-CM | POA: Diagnosis not present

## 2016-08-07 DIAGNOSIS — Z7901 Long term (current) use of anticoagulants: Secondary | ICD-10-CM | POA: Diagnosis not present

## 2016-08-12 DIAGNOSIS — H43813 Vitreous degeneration, bilateral: Secondary | ICD-10-CM | POA: Diagnosis not present

## 2016-08-12 DIAGNOSIS — H353211 Exudative age-related macular degeneration, right eye, with active choroidal neovascularization: Secondary | ICD-10-CM | POA: Diagnosis not present

## 2016-08-12 DIAGNOSIS — H353223 Exudative age-related macular degeneration, left eye, with inactive scar: Secondary | ICD-10-CM | POA: Diagnosis not present

## 2016-08-21 DIAGNOSIS — Z7901 Long term (current) use of anticoagulants: Secondary | ICD-10-CM | POA: Diagnosis not present

## 2016-08-25 DIAGNOSIS — Z7901 Long term (current) use of anticoagulants: Secondary | ICD-10-CM | POA: Diagnosis not present

## 2016-09-18 DIAGNOSIS — Z7901 Long term (current) use of anticoagulants: Secondary | ICD-10-CM | POA: Diagnosis not present

## 2016-09-23 DIAGNOSIS — H353211 Exudative age-related macular degeneration, right eye, with active choroidal neovascularization: Secondary | ICD-10-CM | POA: Diagnosis not present

## 2016-09-23 DIAGNOSIS — H353223 Exudative age-related macular degeneration, left eye, with inactive scar: Secondary | ICD-10-CM | POA: Diagnosis not present

## 2016-09-23 DIAGNOSIS — H43813 Vitreous degeneration, bilateral: Secondary | ICD-10-CM | POA: Diagnosis not present

## 2016-10-02 DIAGNOSIS — Z7901 Long term (current) use of anticoagulants: Secondary | ICD-10-CM | POA: Diagnosis not present

## 2016-10-30 DIAGNOSIS — Z7901 Long term (current) use of anticoagulants: Secondary | ICD-10-CM | POA: Diagnosis not present

## 2016-10-30 DIAGNOSIS — Z23 Encounter for immunization: Secondary | ICD-10-CM | POA: Diagnosis not present

## 2016-11-10 DIAGNOSIS — H353223 Exudative age-related macular degeneration, left eye, with inactive scar: Secondary | ICD-10-CM | POA: Diagnosis not present

## 2016-11-10 DIAGNOSIS — H43813 Vitreous degeneration, bilateral: Secondary | ICD-10-CM | POA: Diagnosis not present

## 2016-11-10 DIAGNOSIS — H353211 Exudative age-related macular degeneration, right eye, with active choroidal neovascularization: Secondary | ICD-10-CM | POA: Diagnosis not present

## 2016-11-27 ENCOUNTER — Inpatient Hospital Stay (HOSPITAL_COMMUNITY): Payer: Medicare Other

## 2016-11-27 ENCOUNTER — Emergency Department (HOSPITAL_COMMUNITY): Payer: Medicare Other

## 2016-11-27 ENCOUNTER — Encounter (HOSPITAL_COMMUNITY): Payer: Self-pay

## 2016-11-27 ENCOUNTER — Inpatient Hospital Stay (HOSPITAL_COMMUNITY)
Admission: EM | Admit: 2016-11-27 | Discharge: 2016-11-30 | DRG: 563 | Disposition: A | Discharge status: Skilled Nursing Facility | Payer: Medicare Other | Attending: Family Medicine | Admitting: Family Medicine

## 2016-11-27 DIAGNOSIS — W010XXA Fall on same level from slipping, tripping and stumbling without subsequent striking against object, initial encounter: Secondary | ICD-10-CM | POA: Diagnosis present

## 2016-11-27 DIAGNOSIS — S7002XA Contusion of left hip, initial encounter: Secondary | ICD-10-CM

## 2016-11-27 DIAGNOSIS — Z66 Do not resuscitate: Secondary | ICD-10-CM | POA: Diagnosis present

## 2016-11-27 DIAGNOSIS — R41841 Cognitive communication deficit: Secondary | ICD-10-CM | POA: Diagnosis not present

## 2016-11-27 DIAGNOSIS — R791 Abnormal coagulation profile: Secondary | ICD-10-CM

## 2016-11-27 DIAGNOSIS — Z820 Family history of epilepsy and other diseases of the nervous system: Secondary | ICD-10-CM | POA: Diagnosis not present

## 2016-11-27 DIAGNOSIS — N179 Acute kidney failure, unspecified: Secondary | ICD-10-CM

## 2016-11-27 DIAGNOSIS — Z823 Family history of stroke: Secondary | ICD-10-CM | POA: Diagnosis not present

## 2016-11-27 DIAGNOSIS — E785 Hyperlipidemia, unspecified: Secondary | ICD-10-CM | POA: Diagnosis present

## 2016-11-27 DIAGNOSIS — H3530 Unspecified macular degeneration: Secondary | ICD-10-CM | POA: Diagnosis present

## 2016-11-27 DIAGNOSIS — E860 Dehydration: Secondary | ICD-10-CM

## 2016-11-27 DIAGNOSIS — Z7901 Long term (current) use of anticoagulants: Secondary | ICD-10-CM | POA: Diagnosis not present

## 2016-11-27 DIAGNOSIS — R627 Adult failure to thrive: Secondary | ICD-10-CM | POA: Diagnosis present

## 2016-11-27 DIAGNOSIS — M6281 Muscle weakness (generalized): Secondary | ICD-10-CM | POA: Diagnosis not present

## 2016-11-27 DIAGNOSIS — N390 Urinary tract infection, site not specified: Secondary | ICD-10-CM | POA: Diagnosis not present

## 2016-11-27 DIAGNOSIS — S5002XA Contusion of left elbow, initial encounter: Secondary | ICD-10-CM | POA: Diagnosis not present

## 2016-11-27 DIAGNOSIS — Z803 Family history of malignant neoplasm of breast: Secondary | ICD-10-CM | POA: Diagnosis not present

## 2016-11-27 DIAGNOSIS — Z90710 Acquired absence of both cervix and uterus: Secondary | ICD-10-CM | POA: Diagnosis not present

## 2016-11-27 DIAGNOSIS — T148XXA Other injury of unspecified body region, initial encounter: Secondary | ICD-10-CM | POA: Diagnosis not present

## 2016-11-27 DIAGNOSIS — S52022D Displaced fracture of olecranon process without intraarticular extension of left ulna, subsequent encounter for closed fracture with routine healing: Secondary | ICD-10-CM | POA: Diagnosis not present

## 2016-11-27 DIAGNOSIS — Z8249 Family history of ischemic heart disease and other diseases of the circulatory system: Secondary | ICD-10-CM

## 2016-11-27 DIAGNOSIS — S52022A Displaced fracture of olecranon process without intraarticular extension of left ulna, initial encounter for closed fracture: Principal | ICD-10-CM | POA: Diagnosis present

## 2016-11-27 DIAGNOSIS — I10 Essential (primary) hypertension: Secondary | ICD-10-CM | POA: Diagnosis present

## 2016-11-27 DIAGNOSIS — Z8673 Personal history of transient ischemic attack (TIA), and cerebral infarction without residual deficits: Secondary | ICD-10-CM

## 2016-11-27 DIAGNOSIS — I441 Atrioventricular block, second degree: Secondary | ICD-10-CM | POA: Diagnosis present

## 2016-11-27 DIAGNOSIS — Z9181 History of falling: Secondary | ICD-10-CM | POA: Diagnosis not present

## 2016-11-27 DIAGNOSIS — Z87891 Personal history of nicotine dependence: Secondary | ICD-10-CM

## 2016-11-27 DIAGNOSIS — M25551 Pain in right hip: Secondary | ICD-10-CM | POA: Diagnosis not present

## 2016-11-27 DIAGNOSIS — T796XXA Traumatic ischemia of muscle, initial encounter: Secondary | ICD-10-CM | POA: Diagnosis present

## 2016-11-27 DIAGNOSIS — S7002XD Contusion of left hip, subsequent encounter: Secondary | ICD-10-CM | POA: Diagnosis not present

## 2016-11-27 DIAGNOSIS — M6282 Rhabdomyolysis: Secondary | ICD-10-CM | POA: Diagnosis present

## 2016-11-27 DIAGNOSIS — W19XXXA Unspecified fall, initial encounter: Secondary | ICD-10-CM | POA: Diagnosis present

## 2016-11-27 DIAGNOSIS — Z4789 Encounter for other orthopedic aftercare: Secondary | ICD-10-CM | POA: Diagnosis not present

## 2016-11-27 DIAGNOSIS — F0150 Vascular dementia without behavioral disturbance: Secondary | ICD-10-CM | POA: Diagnosis not present

## 2016-11-27 DIAGNOSIS — R278 Other lack of coordination: Secondary | ICD-10-CM | POA: Diagnosis not present

## 2016-11-27 DIAGNOSIS — R2681 Unsteadiness on feet: Secondary | ICD-10-CM | POA: Diagnosis not present

## 2016-11-27 LAB — URINALYSIS, ROUTINE W REFLEX MICROSCOPIC
Bilirubin Urine: NEGATIVE
Glucose, UA: NEGATIVE mg/dL
Ketones, ur: 5 mg/dL — AB
Nitrite: NEGATIVE
Protein, ur: 100 mg/dL — AB
Specific Gravity, Urine: 1.023 (ref 1.005–1.030)
pH: 5 (ref 5.0–8.0)

## 2016-11-27 LAB — CBC WITH DIFFERENTIAL/PLATELET
Basophils Absolute: 0 10*3/uL (ref 0.0–0.1)
Basophils Relative: 0 %
Eosinophils Absolute: 0 10*3/uL (ref 0.0–0.7)
Eosinophils Relative: 0 %
HCT: 43.8 % (ref 36.0–46.0)
Hemoglobin: 14.7 g/dL (ref 12.0–15.0)
Lymphocytes Relative: 4 %
Lymphs Abs: 0.4 10*3/uL — ABNORMAL LOW (ref 0.7–4.0)
MCH: 32.1 pg (ref 26.0–34.0)
MCHC: 33.6 g/dL (ref 30.0–36.0)
MCV: 95.6 fL (ref 78.0–100.0)
Monocytes Absolute: 1.3 10*3/uL — ABNORMAL HIGH (ref 0.1–1.0)
Monocytes Relative: 12 %
Neutro Abs: 9.3 10*3/uL — ABNORMAL HIGH (ref 1.7–7.7)
Neutrophils Relative %: 84 %
Platelets: 253 10*3/uL (ref 150–400)
RBC: 4.58 MIL/uL (ref 3.87–5.11)
RDW: 13.7 % (ref 11.5–15.5)
WBC: 11 10*3/uL — ABNORMAL HIGH (ref 4.0–10.5)

## 2016-11-27 LAB — COMPREHENSIVE METABOLIC PANEL
ALT: 43 U/L (ref 14–54)
AST: 113 U/L — ABNORMAL HIGH (ref 15–41)
Albumin: 4.1 g/dL (ref 3.5–5.0)
Alkaline Phosphatase: 97 U/L (ref 38–126)
Anion gap: 12 (ref 5–15)
BUN: 30 mg/dL — ABNORMAL HIGH (ref 6–20)
CO2: 25 mmol/L (ref 22–32)
Calcium: 9.7 mg/dL (ref 8.9–10.3)
Chloride: 105 mmol/L (ref 101–111)
Creatinine, Ser: 2.12 mg/dL — ABNORMAL HIGH (ref 0.44–1.00)
GFR calc Af Amer: 22 mL/min — ABNORMAL LOW (ref >60)
GFR calc non Af Amer: 19 mL/min — ABNORMAL LOW (ref >60)
Glucose, Bld: 139 mg/dL — ABNORMAL HIGH (ref 65–99)
Potassium: 4.4 mmol/L (ref 3.5–5.1)
Sodium: 142 mmol/L (ref 135–145)
Total Bilirubin: 2.2 mg/dL — ABNORMAL HIGH (ref 0.3–1.2)
Total Protein: 7.1 g/dL (ref 6.5–8.1)

## 2016-11-27 LAB — CK: Total CK: 4949 U/L — ABNORMAL HIGH (ref 38–234)

## 2016-11-27 LAB — PROTIME-INR
INR: 6.53
Prothrombin Time: 56.7 seconds — ABNORMAL HIGH (ref 11.4–15.2)

## 2016-11-27 MED ORDER — SODIUM CHLORIDE 0.9 % IV BOLUS (SEPSIS)
500.0000 mL | Freq: Once | INTRAVENOUS | Status: AC
  Administered 2016-11-27: 500 mL via INTRAVENOUS

## 2016-11-27 MED ORDER — SODIUM CHLORIDE 0.9 % IV SOLN
INTRAVENOUS | Status: AC
  Administered 2016-11-28 (×2): via INTRAVENOUS

## 2016-11-27 MED ORDER — SODIUM CHLORIDE 0.9 % IV SOLN
Freq: Once | INTRAVENOUS | Status: AC
  Administered 2016-11-27: 15:00:00 via INTRAVENOUS

## 2016-11-27 MED ORDER — HYDRALAZINE HCL 20 MG/ML IJ SOLN: 10.0000 mg | Freq: Four times a day (QID) | INTRAMUSCULAR | Status: DC | PRN

## 2016-11-27 MED ORDER — ACETAMINOPHEN 325 MG PO TABS: 650.0000 mg | ORAL_TABLET | Freq: Every day | ORAL | Status: DC | PRN

## 2016-11-27 MED ORDER — DEXTROSE 5 % IV SOLN
1.0000 g | INTRAVENOUS | Status: DC
  Administered 2016-11-27 – 2016-11-29 (×3): 1 g via INTRAVENOUS
  Filled 2016-11-27 (×4): qty 10

## 2016-11-27 MED ORDER — TRAZODONE HCL 50 MG PO TABS
50.0000 mg | ORAL_TABLET | Freq: Every day | ORAL | Status: DC
  Administered 2016-11-27 – 2016-11-29 (×3): 50 mg via ORAL
  Filled 2016-11-27 (×3): qty 1

## 2016-11-27 MED ORDER — TETRAHYDROZOLINE HCL 0.05 % OP SOLN
1.0000 [drp] | Freq: Every day | OPHTHALMIC | Status: DC | PRN
  Administered 2016-11-28 (×2): 1 [drp] via OPHTHALMIC
  Filled 2016-11-27: qty 15

## 2016-11-27 MED ORDER — TRAMADOL HCL 50 MG PO TABS
50.0000 mg | ORAL_TABLET | Freq: Two times a day (BID) | ORAL | Status: DC
  Administered 2016-11-27 – 2016-11-30 (×6): 50 mg via ORAL
  Filled 2016-11-27 (×6): qty 1

## 2016-11-27 NOTE — ED Notes (Signed)
Family at bedside. 

## 2016-11-27 NOTE — ED Provider Notes (Signed)
Medical screening examination/treatment/procedure(s) were conducted as a shared visit with non-physician practitioner(s) and myself.  I personally evaluated the patient during the encounter.   EKG Interpretation  Date/Time:  Friday November 27 2016 13:40:11 EDT Ventricular Rate:  71 PR Interval:    QRS Duration: 91 QT Interval:  361 QTC Calculation: 393 R Axis:   11 Text Interpretation:  Sinus rhythm Prolonged PR interval Anteroseptal infarct, age indeterminate Confirmed by Lorre NickAllen, Cayleigh Paull ([id number]) on 11/27/2016 3:17:405 PM     83 year old female after a fall.  Patient has evidence of early rhabdomyolysis as well as acute kidney injury.  Will IV hydrate here and admit to the medicine service   Lorre NickAllen, Laray Rivkin, MD 11/27/16 1517

## 2016-11-27 NOTE — Consult Note (Signed)
[facility] x-ray findings of possible avulsion off of olecranon. I reviewed the films and no orthopaedic intervention is needed from the injury. Would recommend sling for comfort. No formal consult to be performed. Please call if you have any questions.  Roby LoftsKevin P. Jeremi Losito, MD [facility] [phone] (phone)

## 2016-11-27 NOTE — H&P (Addendum)
History and Physical  Laura Diaz ZOX:[id number] DOB: 1922-08-16 DOA: 11/27/2016  Referring physician: EDP PCP: [name], MD   Chief Complaint: Found down at home  HPI: Laura Diaz is a 83 y.o. female   history of stroke on Coumadin for the last 34yrs, History of vascular dementia, macular degeneration is brought to the hospital due to found down at home.  Patient is alert oriented x3 however cannot provide history.  She lives by herself, family went to check on her yesterday around lunch time, she was at baseline at that time.  this morning she did not answer phone call , so daughter went to check on her, she was found on the floor.  She cannot remember how long she has been laying on the floor.  She has bruises to bilateral elbow and bilateral knees she is brought to the ER.  ER course, her blood pressure is slightly elevated, otherwise no fever, no hypoxia.  CT head and CT cervical neck no acute findings. hip x-ray unremarkable.  elbow x-ray "Tiny fracture along the posterior olecranon with overlying soft tissue swelling" Basic lab work showed WBC 11, hemoglobin stable, elevated BUN 30, creatinine is 2.12, elevated CK around 5000, elevated INR 6.5. UA concerning for UTI. She is given IV fluids, hospitalist called to admit the patient.  Patient cannot provide much history, does report left hip pain, denies pain in left elbow. she denies of headache, no chest pain ,no shortness of breath, no edema, denies abdominal pain.     Review of Systems:  Detail per HPI, Review of systems are otherwise negative  Past Medical History:  Diagnosis Date  . Macular degeneration   . Renal disorder    stage 3 per daughter  . Stroke    Past Surgical History:  Procedure Laterality Date  . HERNIA REPAIR  years ago   umbilical  . PARTIAL HYSTERECTOMY  2006   removed gall bladder   Social History:  reports that she has quit smoking. She does not have any smokeless tobacco history on  file. She reports that she does not drink alcohol or use drugs. Patient lives at home & walks with a walker  Allergies  Allergen Reactions  . Pollen Extract     EXCESSIVE WATERING OF EYES    Family History  Problem Relation Age of Onset  . Breast cancer Sister   . Parkinson's disease Sister   . Hypertension Son   . Stroke Father       Prior to Admission medications   Medication Sig Start Date End Date Taking? Authorizing Provider  acetaminophen (TYLENOL) 325 MG tablet Take 650 mg by mouth daily as needed.   Yes [provider]  Aflibercept (EYLEA) 2 MG/0.05ML SOLN 1 drop by Intravitreal route. Into the right eye every 2 months at physician's office   Yes [provider]  atorvastatin (LIPITOR) 20 MG tablet Take 1 tablet (20 mg total) by mouth daily at 6 PM. 03/19/14  Yes Regalado, Belkys A, MD  Misc Natural Products (MIDNITE PO) Take 1 tablet by mouth at bedtime as needed.   Yes [provider]  Tetrahydrozoline HCl (VISINE OP) Place 1 drop into the right eye daily as needed (eye discomfort).   Yes [provider]  tobramycin (TOBREX) 0.3 % ophthalmic solution Place 1 drop into the right eye every 4 (four) hours. Use 1 day before and 2 days after procedure   Yes [provider]  traMADol (ULTRAM) 50 MG tablet  Take 50 mg by mouth 2 (two) times daily.   Yes [provider]  traZODone (DESYREL) 50 MG tablet Take 50 mg by mouth at bedtime.   Yes [provider]  warfarin (COUMADIN) 5 MG tablet Do not take coumadin on 2-22. INR check on 2-23 03/19/14  Yes Regalado, Belkys A, MD    Physical Exam: BP (!) 176/75 (BP Location: Left Arm)   Pulse 67   Temp 98.1 F (36.7 C) (Oral)   Resp 19   SpO2 96%   General:  Frail, aaox3, NAD Eyes: mild conjuntival erythema left eye, PERRL ENT: unremarkable Neck: supple, no JVD Cardiovascular: RRR Respiratory: CTABL Abdomen: soft/NT/ND, positive bowel sounds Skin: scattered bruising,    Musculoskeletal:  No edema Psychiatric: calm/cooperative Neurologic: no focal findings            Labs on Admission:  Basic Metabolic Panel:  Recent Labs Lab 11/27/16 1320  NA 142  K 4.4  CL 105  CO2 25  GLUCOSE 139*  BUN 30*  CREATININE 2.12*  CALCIUM 9.7   Liver Function Tests:  Recent Labs Lab 11/27/16 1320  AST 113*  ALT 43  ALKPHOS 97  BILITOT 2.2*  PROT 7.1  ALBUMIN 4.1   No results for input(s): LIPASE, AMYLASE in the last 168 hours. No results for input(s): AMMONIA in the last 168 hours. CBC:  Recent Labs Lab 11/27/16 1320  WBC 11.0*  NEUTROABS 9.3*  HGB 14.7  HCT 43.8  MCV 95.6  PLT 253   Cardiac Enzymes:  Recent Labs Lab 11/27/16 1320  CKTOTAL 4,949*    BNP (last 3 results) No results for input(s): BNP in the last 8760 hours.  ProBNP (last 3 results) No results for input(s): PROBNP in the last 8760 hours.  CBG: No results for input(s): GLUCAP in the last 168 hours.  Radiological Exams on Admission: Dg Elbow Complete Left  Result Date: 11/27/2016 CLINICAL DATA:  Acute left elbow pain following fall today. Initial encounter. EXAM: LEFT ELBOW - COMPLETE 3+ VIEW COMPARISON:  None. FINDINGS: A tiny bony fracture fragment along the posterior olecranon noted with overlying soft tissue swelling. No other fracture, subluxation or dislocation identified. There is no evidence of joint effusion. IMPRESSION: Tiny fracture along the posterior olecranon with overlying soft tissue swelling. Electronically Signed   By: Harmon Pier M.D.   On: 11/27/2016 14:13   Ct Head Wo Contrast  Result Date: 11/27/2016 CLINICAL DATA:  Patient and was found on the floor by daughter. Head and neck trauma. Patient takes Coumadin. EXAM: CT HEAD WITHOUT CONTRAST CT CERVICAL SPINE WITHOUT CONTRAST TECHNIQUE: Multidetector CT imaging of the head and cervical spine was performed following the standard protocol without intravenous contrast. Multiplanar CT image  reconstructions of the cervical spine were also generated. COMPARISON:  03/16/2014 FINDINGS: CT HEAD FINDINGS BRAIN: There is sulcal and ventricular prominence consistent with superficial and central atrophy. No intraparenchymal hemorrhage, mass effect nor midline shift. Periventricular and subcortical white matter hypodensities consistent with chronic small vessel ischemic disease are identified. No acute large vascular territory infarcts. No abnormal extra-axial fluid collections. Basal cisterns are not effaced and midline. VASCULAR: Mild to moderate calcific atherosclerosis of the carotid siphons. SKULL: No skull fracture. No significant scalp soft tissue swelling. SINUSES/ORBITS: The mastoid air-cells are clear. The included paranasal sinuses are well-aerated.The included ocular globes and orbital contents are non-suspicious. Bilateral lens surgery. OTHER: None. CT CERVICAL SPINE FINDINGS Alignment: There is reversal cervical lordosis with apex at C5-6 secondary to degenerative  disc disease. Intact atlantodental interval and craniocervical relationship. Anterolisthesis of C3 on C4, C4 on C5 and C7 on T1 grade 1 likely on the basis of degenerative disc and facet arthropathy. Skull base and vertebrae: No acute fracture. No primary bone lesion or focal pathologic process. Soft tissues and spinal canal: No prevertebral fluid or swelling. No visible canal hematoma. Disc levels: No significant central canal stenosis. Moderate-to-marked disc flattening C5-6, C6-7 and moderate at C7-T1 and T1-T2. Bilateral multilevel degenerative facet arthropathy C3 through C7 bilaterally with mild bilateral C5-6 and C6-7 neural foraminal encroachment. Left C3-4 and bilateral uncovertebral joint osteoarthritis with spurring at C5-6 and C6-7. Upper chest: Moderate aortic atherosclerosis of the included aortic arch and origins of the great vessels. Mild thyromegaly with retrosternal extension. No acute pulmonary abnormality at the  apices. There is minimal scarring at the left lung apex. Other: None IMPRESSION: 1. Chronic moderate small vessel ischemic disease of periventricular and deep white matter. No acute intracranial abnormality. 2. No acute cervical spine fracture. 3. Cervical spondylosis with marked disc space narrowing C5-6 and C6-7. Associated bilateral neural foraminal mild encroachment from osteophytes at these levels. 4. Degenerative anterolisthesis grade 1 of C3 on C4, C4 and C5 and C7 on T1. Electronically Signed   By: Tollie Eth M.D.   On: 11/27/2016 14:33   Ct Cervical Spine Wo Contrast  Result Date: 11/27/2016 CLINICAL DATA:  Patient and was found on the floor by daughter. Head and neck trauma. Patient takes Coumadin. EXAM: CT HEAD WITHOUT CONTRAST CT CERVICAL SPINE WITHOUT CONTRAST TECHNIQUE: Multidetector CT imaging of the head and cervical spine was performed following the standard protocol without intravenous contrast. Multiplanar CT image reconstructions of the cervical spine were also generated. COMPARISON:  03/16/2014 FINDINGS: CT HEAD FINDINGS BRAIN: There is sulcal and ventricular prominence consistent with superficial and central atrophy. No intraparenchymal hemorrhage, mass effect nor midline shift. Periventricular and subcortical white matter hypodensities consistent with chronic small vessel ischemic disease are identified. No acute large vascular territory infarcts. No abnormal extra-axial fluid collections. Basal cisterns are not effaced and midline. VASCULAR: Mild to moderate calcific atherosclerosis of the carotid siphons. SKULL: No skull fracture. No significant scalp soft tissue swelling. SINUSES/ORBITS: The mastoid air-cells are clear. The included paranasal sinuses are well-aerated.The included ocular globes and orbital contents are non-suspicious. Bilateral lens surgery. OTHER: None. CT CERVICAL SPINE FINDINGS Alignment: There is reversal cervical lordosis with apex at C5-6 secondary to degenerative  disc disease. Intact atlantodental interval and craniocervical relationship. Anterolisthesis of C3 on C4, C4 on C5 and C7 on T1 grade 1 likely on the basis of degenerative disc and facet arthropathy. Skull base and vertebrae: No acute fracture. No primary bone lesion or focal pathologic process. Soft tissues and spinal canal: No prevertebral fluid or swelling. No visible canal hematoma. Disc levels: No significant central canal stenosis. Moderate-to-marked disc flattening C5-6, C6-7 and moderate at C7-T1 and T1-T2. Bilateral multilevel degenerative facet arthropathy C3 through C7 bilaterally with mild bilateral C5-6 and C6-7 neural foraminal encroachment. Left C3-4 and bilateral uncovertebral joint osteoarthritis with spurring at C5-6 and C6-7. Upper chest: Moderate aortic atherosclerosis of the included aortic arch and origins of the great vessels. Mild thyromegaly with retrosternal extension. No acute pulmonary abnormality at the apices. There is minimal scarring at the left lung apex. Other: None IMPRESSION: 1. Chronic moderate small vessel ischemic disease of periventricular and deep white matter. No acute intracranial abnormality. 2. No acute cervical spine fracture. 3. Cervical spondylosis with marked disc space  narrowing C5-6 and C6-7. Associated bilateral neural foraminal mild encroachment from osteophytes at these levels. 4. Degenerative anterolisthesis grade 1 of C3 on C4, C4 and C5 and C7 on T1. Electronically Signed   By: Tollie Ethavid  Kwon M.D.   On: 11/27/2016 14:33   Dg Hip Unilat W Or Wo Pelvis 2-3 Views Left  Result Date: 11/27/2016 CLINICAL DATA:  Acute left hip pain following fall today. Initial encounter. EXAM: DG HIP (WITH OR WITHOUT PELVIS) 2-3V LEFT COMPARISON:  None. FINDINGS: There is no evidence of hip fracture or dislocation. There is no evidence of arthropathy or other focal bone abnormality. IMPRESSION: Negative. Electronically Signed   By: Harmon PierJeffrey  Hu M.D.   On: 11/27/2016 14:12    EKG:  Independently reviewed.  Sinus rhythm, poor poor R wave progression, prolonged PR interval, no acute ST-T changes.  Assessment/Plan Present on Admission: **None**   Fall with Rhabdomyolysis - continue hydration, check daily CK, hold statin -fall prevention, PT eval  Supratherapeutic INR  hold Coumadin  continue hydration  check daily INR family is interested in switching to Eliquis prior to discharge.  Acute renal failure Likely multifactorial, including dehydration, possible UTI and elevated CK  -continue hydration - empiric start Rocephin , urine culture pending - repeat lab in the morning - renal dosing meds  History of CVA, vascular dementia -Hold Coumadin due to supratherapeutic  INR -Hold statin due to elevated CK -Family is considering Eliquis at discharge  Hypertension  on presentation her blood pressure is elevated ,she is not on any blood pressure medication at home Possible stress related hypertension  will start IV hydralazine as needed   FTT: get PT eval , family is interested in taking her back home at discharge, likely will need home health ,Case manager consulted.  DVT prophylaxis: supratherapeutic INR  Consultants: none  Code Status: DNR  Family Communication:  Patient , her son and daughter at bedside  Disposition Plan: admit to med tele  Time spent: 75mins  Anniyah Mood MD, PhD [facility] Pager [phone] If 7PM-7AM, please contact night-coverage at [email or web address], password [email or web address]

## 2016-11-27 NOTE — ED Provider Notes (Signed)
[facility]-EMERGENCY DEPT Provider Note   CSN: [id number] Arrival date & time: 11/27/16  1125     History   Chief Complaint Chief Complaint  Patient presents with  . Fall    HPI Laura Diaz is a 83 y.o. female.  HPI Laura Diaz is a 83 y.o. female presents to emergency department complaining of a fall.  Patient with history of dementia, poor historian.  Patient lives at home alone, has healthcare that comes daily.  Patient's daughter is providing history and states that the came to check on her this morning after she did not answer her phone.  Patient was found on the floor.  Patient was unable to get up.  EMS was called.  Patient is complaining of pain to the left hip.  Patient also reports bruising to bilateral elbows, but patient is on Coumadin for history of CVA.  It is unclear when patient fell, she reports different story each time asked.  Patient told me she has been on the floor for 2 hours.  At this time she denies headache, nausea, vomiting, dizziness. No treatment prior to coming in.  Past Medical History:  Diagnosis Date  . Macular degeneration   . Renal disorder    stage 3 per daughter  . Stroke     Patient Active Problem List   Diagnosis Date Noted  . Encephalopathy 05/18/2014  . Chronic anticoagulation 05/18/2014  . Macular degeneration 05/18/2014  . Confusion with non-focal neuro exam   . Hyperlipidemia   . Stroke (HCC) 03/17/2014  . Delirium 03/16/2014  . Dementia 03/16/2014  . Encephalopathy acute 03/16/2014    Past Surgical History:  Procedure Laterality Date  . HERNIA REPAIR  years ago   umbilical  . PARTIAL HYSTERECTOMY  2006   removed gall bladder    OB History    No data available       Home Medications    Prior to Admission medications   Medication Sig Start Date End Date Taking? Authorizing Provider  acetaminophen (TYLENOL) 325 MG tablet Take 650 mg by mouth daily as needed.   Yes [provider]  Aflibercept (EYLEA) 2 MG/0.05ML SOLN 1 drop by Intravitreal route. Into the right eye every 2 months at physician's office   Yes [provider]  atorvastatin (LIPITOR) 20 MG tablet Take 1 tablet (20 mg total) by mouth daily at 6 PM. 03/19/14  Yes Regalado, Belkys A, MD  Misc Natural Products (MIDNITE PO) Take 1 tablet by mouth at bedtime as needed.   Yes [provider]  Tetrahydrozoline HCl (VISINE OP) Place 1 drop into the right eye daily as needed (eye discomfort).   Yes [provider]  tobramycin (TOBREX) 0.3 % ophthalmic solution Place 1 drop into the right eye every 4 (four) hours. Use 1 day before and 2 days after procedure   Yes [provider]  traMADol (ULTRAM) 50 MG tablet Take 50 mg by mouth 2 (two) times daily.   Yes [provider]  traZODone (DESYREL) 50 MG tablet Take 50 mg by mouth at bedtime.   Yes [provider]  warfarin (COUMADIN) 5 MG tablet Do not take coumadin on 2-22. INR check on 2-23 03/19/14  Yes Regalado, Prentiss Bells, MD    Family History Family History  Problem Relation Age of Onset  . Breast cancer Sister   . Parkinson's disease Sister   . Hypertension Son   . Stroke Father     Social  History Social History  Substance Use Topics  . Smoking status: Former Games developer  . Smokeless tobacco: Not on file     Comment: many years ago  . Alcohol use No     Allergies   Pollen extract   Review of Systems Review of Systems  Unable to perform ROS: Dementia  Musculoskeletal: Positive for arthralgias and myalgias.     Physical Exam Updated Vital Signs BP 133/65 (BP Location: Right Arm)   Pulse 73   Temp 98.1 F (36.7 C) (Oral)   Resp 18   SpO2 95%   Physical Exam  Constitutional: She is oriented to person, place, and time. She appears well-developed and well-nourished. No distress.  HENT:  Head: Normocephalic.  TMs normal bilaterally with no hemotympanum.  Oral mucosa, lips are dry    Eyes: Conjunctivae are normal.  Left conjunctiva erythematous.  No drainage.  Neck: Normal range of motion. Neck supple.  No midline tenderness.  Cardiovascular: Normal rate and regular rhythm.   Murmur heard. Pulmonary/Chest: Effort normal and breath sounds normal. No respiratory distress. She has no wheezes. She has no rales.  Abdominal: Soft. Bowel sounds are normal. She exhibits no distension. There is no tenderness. There is no rebound.  Musculoskeletal: She exhibits no edema.  No midline tenderness to thoracic or lumbar spine, no bruising to the back.  Full range of motion of bilateral upper and lower extremities.  Pain with range of motion of left hip and left elbow, however full range of motion of both joints.  There is bruising and superficial abrasions to bilateral elbows, bilateral knees.  Distal radial and dorsalis pedis pulses intact and equal bilaterally.  Neurological: She is alert and oriented to person, place, and time.  Skin: Skin is warm and dry.  Psychiatric: She has a normal mood and affect. Her behavior is normal.  Nursing note and vitals reviewed.    ED Treatments / Results  Labs (all labs ordered are listed, but only abnormal results are displayed) Labs Reviewed  CBC WITH DIFFERENTIAL/PLATELET - Abnormal; Notable for the following:       Result Value   WBC 11.0 (*)    Neutro Abs 9.3 (*)    Lymphs Abs 0.4 (*)    Monocytes Absolute 1.3 (*)    All other components within normal limits  COMPREHENSIVE METABOLIC PANEL - Abnormal; Notable for the following:    Glucose, Bld 139 (*)    BUN 30 (*)    Creatinine, Ser 2.12 (*)    AST 113 (*)    Total Bilirubin 2.2 (*)    GFR calc non Af Amer 19 (*)    GFR calc Af Amer 22 (*)    All other components within normal limits  CK - Abnormal; Notable for the following:    Total CK 4,949 (*)    All other components within normal limits  URINALYSIS, ROUTINE W REFLEX MICROSCOPIC - Abnormal; Notable for the following:     Color, Urine AMBER (*)    APPearance CLOUDY (*)    Hgb urine dipstick LARGE (*)    Ketones, ur 5 (*)    Protein, ur 100 (*)    Leukocytes, UA SMALL (*)    Bacteria, UA MANY (*)    Squamous Epithelial / LPF 6-30 (*)    All other components within normal limits  PROTIME-INR - Abnormal; Notable for the following:    Prothrombin Time 56.7 (*)    INR 6.53 (*)    All other components  within normal limits    EKG  EKG Interpretation  Date/Time:  Friday November 27 2016 13:40:11 EDT Ventricular Rate:  71 PR Interval:    QRS Duration: 91 QT Interval:  361 QTC Calculation: 393 R Axis:   11 Text Interpretation:  Sinus rhythm Prolonged PR interval Anteroseptal infarct, age indeterminate Confirmed by Lorre Nick ([id number]) on 11/27/2016 3:17:05 PM       Radiology Dg Elbow Complete Left  Result Date: 11/27/2016 CLINICAL DATA:  Acute left elbow pain following fall today. Initial encounter. EXAM: LEFT ELBOW - COMPLETE 3+ VIEW COMPARISON:  None. FINDINGS: A tiny bony fracture fragment along the posterior olecranon noted with overlying soft tissue swelling. No other fracture, subluxation or dislocation identified. There is no evidence of joint effusion. IMPRESSION: Tiny fracture along the posterior olecranon with overlying soft tissue swelling. Electronically Signed   By: Harmon Pier M.D.   On: 11/27/2016 14:13   Ct Head Wo Contrast  Result Date: 11/27/2016 CLINICAL DATA:  Patient and was found on the floor by daughter. Head and neck trauma. Patient takes Coumadin. EXAM: CT HEAD WITHOUT CONTRAST CT CERVICAL SPINE WITHOUT CONTRAST TECHNIQUE: Multidetector CT imaging of the head and cervical spine was performed following the standard protocol without intravenous contrast. Multiplanar CT image reconstructions of the cervical spine were also generated. COMPARISON:  03/16/2014 FINDINGS: CT HEAD FINDINGS BRAIN: There is sulcal and ventricular prominence consistent with superficial and central atrophy. No  intraparenchymal hemorrhage, mass effect nor midline shift. Periventricular and subcortical white matter hypodensities consistent with chronic small vessel ischemic disease are identified. No acute large vascular territory infarcts. No abnormal extra-axial fluid collections. Basal cisterns are not effaced and midline. VASCULAR: Mild to moderate calcific atherosclerosis of the carotid siphons. SKULL: No skull fracture. No significant scalp soft tissue swelling. SINUSES/ORBITS: The mastoid air-cells are clear. The included paranasal sinuses are well-aerated.The included ocular globes and orbital contents are non-suspicious. Bilateral lens surgery. OTHER: None. CT CERVICAL SPINE FINDINGS Alignment: There is reversal cervical lordosis with apex at C5-6 secondary to degenerative disc disease. Intact atlantodental interval and craniocervical relationship. Anterolisthesis of C3 on C4, C4 on C5 and C7 on T1 grade 1 likely on the basis of degenerative disc and facet arthropathy. Skull base and vertebrae: No acute fracture. No primary bone lesion or focal pathologic process. Soft tissues and spinal canal: No prevertebral fluid or swelling. No visible canal hematoma. Disc levels: No significant central canal stenosis. Moderate-to-marked disc flattening C5-6, C6-7 and moderate at C7-T1 and T1-T2. Bilateral multilevel degenerative facet arthropathy C3 through C7 bilaterally with mild bilateral C5-6 and C6-7 neural foraminal encroachment. Left C3-4 and bilateral uncovertebral joint osteoarthritis with spurring at C5-6 and C6-7. Upper chest: Moderate aortic atherosclerosis of the included aortic arch and origins of the great vessels. Mild thyromegaly with retrosternal extension. No acute pulmonary abnormality at the apices. There is minimal scarring at the left lung apex. Other: None IMPRESSION: 1. Chronic moderate small vessel ischemic disease of periventricular and deep white matter. No acute intracranial abnormality. 2. No  acute cervical spine fracture. 3. Cervical spondylosis with marked disc space narrowing C5-6 and C6-7. Associated bilateral neural foraminal mild encroachment from osteophytes at these levels. 4. Degenerative anterolisthesis grade 1 of C3 on C4, C4 and C5 and C7 on T1. Electronically Signed   By: Tollie Eth M.D.   On: 11/27/2016 14:33   Ct Cervical Spine Wo Contrast  Result Date: 11/27/2016 CLINICAL DATA:  Patient and was found on the floor by daughter. Head  and neck trauma. Patient takes Coumadin. EXAM: CT HEAD WITHOUT CONTRAST CT CERVICAL SPINE WITHOUT CONTRAST TECHNIQUE: Multidetector CT imaging of the head and cervical spine was performed following the standard protocol without intravenous contrast. Multiplanar CT image reconstructions of the cervical spine were also generated. COMPARISON:  03/16/2014 FINDINGS: CT HEAD FINDINGS BRAIN: There is sulcal and ventricular prominence consistent with superficial and central atrophy. No intraparenchymal hemorrhage, mass effect nor midline shift. Periventricular and subcortical white matter hypodensities consistent with chronic small vessel ischemic disease are identified. No acute large vascular territory infarcts. No abnormal extra-axial fluid collections. Basal cisterns are not effaced and midline. VASCULAR: Mild to moderate calcific atherosclerosis of the carotid siphons. SKULL: No skull fracture. No significant scalp soft tissue swelling. SINUSES/ORBITS: The mastoid air-cells are clear. The included paranasal sinuses are well-aerated.The included ocular globes and orbital contents are non-suspicious. Bilateral lens surgery. OTHER: None. CT CERVICAL SPINE FINDINGS Alignment: There is reversal cervical lordosis with apex at C5-6 secondary to degenerative disc disease. Intact atlantodental interval and craniocervical relationship. Anterolisthesis of C3 on C4, C4 on C5 and C7 on T1 grade 1 likely on the basis of degenerative disc and facet arthropathy. Skull base  and vertebrae: No acute fracture. No primary bone lesion or focal pathologic process. Soft tissues and spinal canal: No prevertebral fluid or swelling. No visible canal hematoma. Disc levels: No significant central canal stenosis. Moderate-to-marked disc flattening C5-6, C6-7 and moderate at C7-T1 and T1-T2. Bilateral multilevel degenerative facet arthropathy C3 through C7 bilaterally with mild bilateral C5-6 and C6-7 neural foraminal encroachment. Left C3-4 and bilateral uncovertebral joint osteoarthritis with spurring at C5-6 and C6-7. Upper chest: Moderate aortic atherosclerosis of the included aortic arch and origins of the great vessels. Mild thyromegaly with retrosternal extension. No acute pulmonary abnormality at the apices. There is minimal scarring at the left lung apex. Other: None IMPRESSION: 1. Chronic moderate small vessel ischemic disease of periventricular and deep white matter. No acute intracranial abnormality. 2. No acute cervical spine fracture. 3. Cervical spondylosis with marked disc space narrowing C5-6 and C6-7. Associated bilateral neural foraminal mild encroachment from osteophytes at these levels. 4. Degenerative anterolisthesis grade 1 of C3 on C4, C4 and C5 and C7 on T1. Electronically Signed   By: Tollie Eth M.D.   On: 11/27/2016 14:33   Dg Hip Unilat W Or Wo Pelvis 2-3 Views Left  Result Date: 11/27/2016 CLINICAL DATA:  Acute left hip pain following fall today. Initial encounter. EXAM: DG HIP (WITH OR WITHOUT PELVIS) 2-3V LEFT COMPARISON:  None. FINDINGS: There is no evidence of hip fracture or dislocation. There is no evidence of arthropathy or other focal bone abnormality. IMPRESSION: Negative. Electronically Signed   By: Harmon Pier M.D.   On: 11/27/2016 14:12    Procedures Procedures (including critical care time)  Medications Ordered in ED Medications  sodium chloride 0.9 % bolus 500 mL (not administered)     Initial Impression / Assessment and Plan / ED Course    I have reviewed the triage vital signs and the nursing notes.  Pertinent labs & imaging results that were available during my care of the patient were reviewed by me and considered in my medical decision making (see chart for details).   Patient seen and examined.  Patient with a fall at home.  Unknown when or how she fell, patient is a poor historian due to dementia.  Patient with multiple bruises to the body, including forehead, bilateral elbows and knees.  Complaining of left  hip pain only.  She is on Coumadin.  Will get CT head and cervical spine given trauma and anticoagulation.  Will check INR.  We will get CK level.  Fluid bolus ordered.  Will check urinalysis and labs.  X-rays of the hip and elbow ordered.  3:13 PM Creatinine 2.12, CK 5000, most consistent with dehydration. INR is 6.53, no active bleeding, CT head negative, will need to hold coumadin. Continues to have left hip pain, xray negative, will order CT. Pt has full ROM of the hip, just painful. Discussed with Dr. Freida BusmanAllen, will admit for rehydration and monitor given head injury and elevated INR.  3:30 PM Spoke with hospitalist. Will admit. Asked to consult with orthopedics regarding olecranon fx.   3:35 PM Spoke with Dr. Jena GaussHaddix who reviewed xray, and advises no treatment at this time. UA and CT hip pending     Final Clinical Impressions(s) / ED Diagnoses   Final diagnoses:  Dehydration  Elevated INR  Closed fracture of olecranon process of left ulna, initial encounter  Contusion of left hip, initial encounter    New Prescriptions New Prescriptions   No medications on file     Jaynie CrumbleKirichenko, Viktor Philipp, PA-C 11/29/16 0028

## 2016-11-27 NOTE — ED Triage Notes (Signed)
[facility]. Patient unable to report when she fell. Daughter found patient in the floor this morning. Patient takes Coumadin (hx of stroke.) Patient reports pain to left upper thigh. Abrasion noted to right elbow.

## 2016-11-27 NOTE — ED Notes (Signed)
Please call Cristy at [phone] @ [phone]. Nino Parsleyark, Eleftherios Dudenhoeffer B

## 2016-11-27 NOTE — ED Notes (Signed)
[facility] Expected date:  Expected time:  Means of arrival:  Comments: 83 yo f fall, left leg pain

## 2016-11-27 NOTE — ED Notes (Addendum)
INR 6.53, EDPA notified

## 2016-11-28 DIAGNOSIS — M6282 Rhabdomyolysis: Secondary | ICD-10-CM | POA: Diagnosis present

## 2016-11-28 DIAGNOSIS — F0150 Vascular dementia without behavioral disturbance: Secondary | ICD-10-CM | POA: Diagnosis present

## 2016-11-28 DIAGNOSIS — Z8673 Personal history of transient ischemic attack (TIA), and cerebral infarction without residual deficits: Secondary | ICD-10-CM

## 2016-11-28 DIAGNOSIS — E860 Dehydration: Secondary | ICD-10-CM

## 2016-11-28 DIAGNOSIS — N390 Urinary tract infection, site not specified: Secondary | ICD-10-CM | POA: Diagnosis present

## 2016-11-28 DIAGNOSIS — R791 Abnormal coagulation profile: Secondary | ICD-10-CM | POA: Diagnosis present

## 2016-11-28 HISTORY — DX: Personal history of transient ischemic attack (TIA), and cerebral infarction without residual deficits: Z86.73

## 2016-11-28 LAB — COMPREHENSIVE METABOLIC PANEL
ALT: 35 U/L (ref 14–54)
AST: 84 U/L — ABNORMAL HIGH (ref 15–41)
Albumin: 3.1 g/dL — ABNORMAL LOW (ref 3.5–5.0)
Alkaline Phosphatase: 72 U/L (ref 38–126)
Anion gap: 9 (ref 5–15)
BUN: 33 mg/dL — ABNORMAL HIGH (ref 6–20)
CO2: 24 mmol/L (ref 22–32)
Calcium: 8.5 mg/dL — ABNORMAL LOW (ref 8.9–10.3)
Chloride: 108 mmol/L (ref 101–111)
Creatinine, Ser: 1.58 mg/dL — ABNORMAL HIGH (ref 0.44–1.00)
GFR calc Af Amer: 32 mL/min — ABNORMAL LOW (ref >60)
GFR calc non Af Amer: 27 mL/min — ABNORMAL LOW (ref >60)
Glucose, Bld: 100 mg/dL — ABNORMAL HIGH (ref 65–99)
Potassium: 3.8 mmol/L (ref 3.5–5.1)
Sodium: 141 mmol/L (ref 135–145)
Total Bilirubin: 1.4 mg/dL — ABNORMAL HIGH (ref 0.3–1.2)
Total Protein: 5.6 g/dL — ABNORMAL LOW (ref 6.5–8.1)

## 2016-11-28 LAB — PROTIME-INR
INR: 6.52
Prothrombin Time: 56.6 seconds — ABNORMAL HIGH (ref 11.4–15.2)

## 2016-11-28 LAB — CBC
HCT: 36.9 % (ref 36.0–46.0)
Hemoglobin: 11.9 g/dL — ABNORMAL LOW (ref 12.0–15.0)
MCH: 31.1 pg (ref 26.0–34.0)
MCHC: 32.2 g/dL (ref 30.0–36.0)
MCV: 96.3 fL (ref 78.0–100.0)
Platelets: 214 10*3/uL (ref 150–400)
RBC: 3.83 MIL/uL — ABNORMAL LOW (ref 3.87–5.11)
RDW: 13.7 % (ref 11.5–15.5)
WBC: 8.9 10*3/uL (ref 4.0–10.5)

## 2016-11-28 LAB — CK: Total CK: 2938 U/L — ABNORMAL HIGH (ref 38–234)

## 2016-11-28 NOTE — Progress Notes (Signed)
[facility] PROGRESS NOTE  Laura KillingsClara Marie Hosterman  ZOX:[id number] DOB: 1922/06/12 DOA: 11/27/2016 PCP: Kirby FunkGriffin, John, MD  Brief Narrative: Laura Diaz is a 83 y.o. female with a history of CVA on coumadin for 20 years, vascular dementia, and macular degeneration who was found down at home with uncertain circumstances around the fall/duration down. In the ED, CT head and CT cervical spine showed no acute findings. Left hip XR unremarkable and subsequent left hip CT showed soft tissue contusion without fracture or dislocation. Left elbow XR showed a "tiny fracture along the posterior olecranon with overlying soft tissue swelling." Creatinine was 2.12, CK elevated at 4,949, and INR INR 6.5. UA concerning for UTI. IV fluids and antibiotics were given. Orthopedics consulted, recommending a sling and conservative care for elbow fracture. CK and SCr improved, though INR remains elevated.  Subjective: Patient reporting left hip tenderness, no pain without moving it. Left elbow is not significantly painful. No bleeding noted.   Objective: BP 120/60 (BP Location: Right Arm)   Pulse 62   Temp (!) 97.5 F (36.4 C) (Oral)   Resp 14   Ht 5\' 5"  (1.651 m)   Wt 88.9 kg (195 lb 15.8 oz)   SpO2 100%   BMI 32.61 kg/m   Gen: Obese, elderly female in no distress Pulm: Clear and nonlabored on room air  CV: RRR, no murmur, no JVD, no edema GI: Soft, NT, ND, +BS  Neuro: Alert and oriented to place and situation, though she's unable to recall any history surrounding the fall. She's given multiple different accounts. No focal deficits. Ext: Warm, no visible deformities, though left elbow is tender to palpation with preserved AROM. Left hip with tenderness to lateral palpation.  Skin: No rashes, lesions no ulcers  Assessment & Plan: Fall with Rhabdomyolysis - Continue IVF, check CMP and CK in AM.  - Fall precautions - PT evaluation.    Supratherapeutic INR: Mild without active bleeding.  - Hold coumadin  and monitor INR. No indication for vitamin K.  - Discussed with family, planning to switch to eliquis once INR in a more acceptable range.   AKI: Due to dehydration, rhabdomyolysis.  - Monitor with IVF's, improving.   UTI: Presumptive Dx.  - Continue ceftriaxone. Leukocytosis resolved.   History of CVA, vascular dementia:  - Stable, holding statin and anticoagulation at this time as above.  - Delirium precautions.   Hypertension: At goal off medications.  - Prn hydralazine   FTT:  - PT evaluation. Plan is to discharge home with son if PT believes she would be able to. Otherwise, discussed with daughter this AM. Pt and daughter amenable to SNF if indicated.   Hazeline Junkeryan Kwan Shellhammer, MD [facility] Pager [phone]  If 7PM-7AM, please contact night-coverage [email or web address] Password [email or web address] 11/28/2016, 6:00 PM

## 2016-11-28 NOTE — Progress Notes (Signed)
CRITICAL VALUE ALERT  Critical Value:  INR 6.52, PT 56.6  Date & Time Notied:  11/28/2016 0715  Provider Notified: Jarvis NewcomerGrunz   Orders Received/Actions taken: Awaiting orders.

## 2016-11-29 ENCOUNTER — Other Ambulatory Visit: Payer: Self-pay

## 2016-11-29 DIAGNOSIS — N390 Urinary tract infection, site not specified: Secondary | ICD-10-CM

## 2016-11-29 DIAGNOSIS — S52022A Displaced fracture of olecranon process without intraarticular extension of left ulna, initial encounter for closed fracture: Principal | ICD-10-CM

## 2016-11-29 DIAGNOSIS — Z8673 Personal history of transient ischemic attack (TIA), and cerebral infarction without residual deficits: Secondary | ICD-10-CM

## 2016-11-29 DIAGNOSIS — S7002XA Contusion of left hip, initial encounter: Secondary | ICD-10-CM

## 2016-11-29 DIAGNOSIS — Z7901 Long term (current) use of anticoagulants: Secondary | ICD-10-CM

## 2016-11-29 LAB — COMPREHENSIVE METABOLIC PANEL
ALT: 37 U/L (ref 14–54)
AST: 68 U/L — ABNORMAL HIGH (ref 15–41)
Albumin: 3 g/dL — ABNORMAL LOW (ref 3.5–5.0)
Alkaline Phosphatase: 74 U/L (ref 38–126)
Anion gap: 7 (ref 5–15)
BUN: 20 mg/dL (ref 6–20)
CO2: 23 mmol/L (ref 22–32)
Calcium: 8.4 mg/dL — ABNORMAL LOW (ref 8.9–10.3)
Chloride: 110 mmol/L (ref 101–111)
Creatinine, Ser: 0.89 mg/dL (ref 0.44–1.00)
GFR calc Af Amer: >60 mL/min (ref >60)
GFR calc non Af Amer: 55 mL/min — ABNORMAL LOW (ref >60)
Glucose, Bld: 104 mg/dL — ABNORMAL HIGH (ref 65–99)
Potassium: 3.7 mmol/L (ref 3.5–5.1)
Sodium: 140 mmol/L (ref 135–145)
Total Bilirubin: 1 mg/dL (ref 0.3–1.2)
Total Protein: 5.5 g/dL — ABNORMAL LOW (ref 6.5–8.1)

## 2016-11-29 LAB — MAGNESIUM: Magnesium: 1.7 mg/dL (ref 1.7–2.4)

## 2016-11-29 LAB — BASIC METABOLIC PANEL
Anion gap: 7 (ref 5–15)
BUN: 16 mg/dL (ref 6–20)
CO2: 25 mmol/L (ref 22–32)
Calcium: 8.3 mg/dL — ABNORMAL LOW (ref 8.9–10.3)
Chloride: 107 mmol/L (ref 101–111)
Creatinine, Ser: 0.82 mg/dL (ref 0.44–1.00)
GFR calc Af Amer: >60 mL/min (ref >60)
GFR calc non Af Amer: >60 mL/min (ref >60)
Glucose, Bld: 145 mg/dL — ABNORMAL HIGH (ref 65–99)
Potassium: 4 mmol/L (ref 3.5–5.1)
Sodium: 139 mmol/L (ref 135–145)

## 2016-11-29 LAB — PROTIME-INR
INR: 4.85
Prothrombin Time: 45 seconds — ABNORMAL HIGH (ref 11.4–15.2)

## 2016-11-29 LAB — CK: Total CK: 1626 U/L — ABNORMAL HIGH (ref 38–234)

## 2016-11-29 NOTE — Evaluation (Signed)
Physical Therapy Evaluation Patient Details Name: Laura Diaz MRN: [id number] DOB: [birth year] Today's Date: 11/29/2016   History of Present Illness  Laura Diaz is a 83 y.o. female with a history of CVA on coumadin for 20 years, vascular dementia, and macular degeneration who was found down at home with uncertain circumstances around the fall/duration down. Pt with L tiny fx of posterior olecranon with conservative management and sling for comfort if needed.  Clinical Impression  Pt admitted with above diagnosis. Pt currently with functional limitations due to the deficits listed below (see PT Problem List).  Pt will benefit from skilled PT to increase their independence and safety with mobility to allow discharge to the venue listed below.  Pt demonstrating decreased safety awareness with decreased balance and wide BOS with SPT.  Do not feel pt is safe to d/c home at this point and recommend short term SNF rehab.  Family is requesting [facility] as pt has a family member there now.       Follow Up Recommendations SNF(Requesting [facility])    Equipment Recommendations  None recommended by PT    Recommendations for Other Services       Precautions / Restrictions Precautions Precautions: Fall Required Braces or Orthoses: Sling(for comfort- did not need at eval) Restrictions Weight Bearing Restrictions: No      Mobility  Bed Mobility Overal bed mobility: Needs Assistance Bed Mobility: Supine to Sit     Supine to sit: Min assist;HOB elevated     General bed mobility comments: Pt required MIN A to get legs started and to A trunk to get fully upright.  Cues to scoot and re-position self at edge of bed. No c/o L elbow pain.  Transfers Overall transfer level: Needs assistance Equipment used: Rolling walker (2 wheeled) Transfers: Sit to/from [employer] Sit to Stand: Min assist Stand pivot transfers: Min assist;Mod assist       General transfer comment: MIN   to power up.  Pt declined ambulation upon standing with wide BOS.  Pt letting go of RW to early and trying to sit before she was safely in front of chair.  And required increased assistance to get hips gully turned.  Ambulation/Gait             General Gait Details: pt declined  Stairs            Wheelchair Mobility    Modified Rankin (Stroke Patients Only)       Balance Overall balance assessment: Needs assistance;History of Falls   Sitting balance-Leahy Scale: Fair     Standing balance support: Bilateral upper extremity supported Standing balance-Leahy Scale: Poor Standing balance comment: wide BOS                             Pertinent Vitals/Pain Pain Assessment: No/denies pain    Home Living Family/patient expects to be discharged to:: Skilled nursing facility Living Arrangements: Alone               Additional Comments: Aide 2 days /week 3-4 hrs for laundry, housekeeping, etc    Prior Function Level of Independence: Independent with assistive device(s)         Comments: Amb with rollator.  Was not using when she fell at home.     Hand Dominance   Dominant Hand: Right    Extremity/Trunk Assessment   Upper Extremity Assessment Upper Extremity Assessment: Generalized weakness    Lower  Extremity Assessment Lower Extremity Assessment: Generalized weakness    Cervical / Trunk Assessment Cervical / Trunk Assessment: Normal  Communication   Communication: HOH  Cognition Arousal/Alertness: Awake/alert Behavior During Therapy: WFL for tasks assessed/performed Overall Cognitive Status: Within Functional Limits for tasks assessed Area of Impairment: Problem solving;Following commands                       Following Commands: Follows multi-step commands inconsistently;Follows one step commands with increased time;Follows one step commands consistently     Problem Solving: Slow processing;Difficulty sequencing General  Comments: Pt pleasant, but seemed disengaged at times, but does have hx of HOH.      General Comments General comments (skin integrity, edema, etc.): bruising noted on forehead    Exercises     Assessment/Plan    PT Assessment Patient needs continued PT services  PT Problem List Decreased strength;Decreased activity tolerance;Decreased balance;Decreased mobility;Decreased safety awareness       PT Treatment Interventions DME instruction;Gait training;Functional mobility training;Balance training;Therapeutic exercise;Therapeutic activities;Patient/family education    PT Goals (Current goals can be found in the Care Plan section)  Acute Rehab PT Goals Patient Stated Goal: keep her independence PT Goal Formulation: With patient/family Time For Goal Achievement: 12/13/16 Potential to Achieve Goals: Good    Frequency Min 3X/week   Barriers to discharge Decreased caregiver support lives alone    Co-evaluation               AM-PAC PT "6 Clicks" Daily Activity  Outcome Measure Difficulty turning over in bed (including adjusting bedclothes, sheets and blankets)?: Unable Difficulty moving from lying on back to sitting on the side of the bed? : Unable Difficulty sitting down on and standing up from a chair with arms (e.g., wheelchair, bedside commode, etc,.)?: Unable Help needed moving to and from a bed to chair (including a wheelchair)?: A Lot Help needed walking in hospital room?: A Lot Help needed climbing 3-5 steps with a railing? : A Lot 6 Click Score: 9    End of Session Equipment Utilized During Treatment: Gait belt Activity Tolerance: Patient tolerated treatment well Patient left: in chair;with chair alarm set;with call bell/phone within reach;with family/visitor present Nurse Communication: Mobility status PT Visit Diagnosis: Unsteadiness on feet (R26.81);Muscle weakness (generalized) (M62.81);History of falling (Z91.81)    Time: [id number] PT Time Calculation  (min) (ACUTE ONLY): 28 min   Charges:   PT Evaluation $PT Eval Moderate Complexity: 1 Mod PT Treatments $Therapeutic Activity: 8-22 mins   PT G Codes:        Marygrace Sandoval L. Katrinka BlazingSmith, [place] Pager [phone] 11/29/2016   Enzo MontgomeryKaren L Shadaya Marschner 11/29/2016, 9:20 AM

## 2016-11-29 NOTE — Progress Notes (Signed)
Per tele, patient had a 2.0 second pause along with missed beats earlier today. Pt currently in 50-60s 1st degree HB, asymptomatic. VSS. On-call MD Toniann FailKakrakandy made aware who ordered BMET and Mag now, and to notify MD again if pause > 3 seconds or patient having repeated pauses. Will continue to monitor.

## 2016-11-29 NOTE — Plan of Care (Signed)
Will continue to review and F/U with plan of care.

## 2016-11-29 NOTE — Clinical Social Work Note (Signed)
Clinical Social Work Assessment  Patient Details  Name: Laura Diaz MRN: [id number] Date of Birth: 03/19/1922  Date of referral:  11/29/16               Reason for consult:  Facility Placement                Permission sought to share information with:  Facility Contact Representative, Family Supports Permission granted to share information::  Yes, Verbal Permission Granted  Name::     Donna  Agency::  SNF  Relationship::  Daughter  Contact Information:     Housing/Transportation Living arrangements for the past 2 months:  Single Family Home Source of Information:  Patient, Adult Children Patient Interpreter Needed:  None Criminal Activity/Legal Involvement Pertinent to Current Situation/Hospitalization:  No - Comment as needed Significant Relationships:  Adult Children, Siblings Lives with:  Self Do you feel safe going back to the place where you live?  Yes Need for family participation in patient care:  No (Coment)  Care giving concerns:  Patient has been living alone with a caregiver helping out a couple hours a day, but will benefit from short term rehab at discharge to improve ability to care for self before returning home.   Social Worker assessment / plan:  CSW met with patient and daughter at bedside to discuss recommendation for SNF placement. CSW provided information on the referral process as well as qualifying stay criteria needing to be met in order for Medicare to provide coverage for rehab. CSW provided information on transport at time of discharge, as well. CSW faxed out referral, and will follow up with [facility] to determine if facility can accept patient and has any beds available.  Employment status:  Retired Insurance information:  Medicare PT Recommendations:  Skilled Nursing Facility Information / Referral to community resources:  Skilled Nursing Facility  Patient/Family's Response to care:  Patient and patient's daughter agreeable to SNF placement and  would like to admit to [facility], if possible.  Patient/Family's Understanding of and Emotional Response to Diagnosis, Current Treatment, and Prognosis:  Patient deferred to daughter during discussion, as she looked towards her daughter to answer questions instead of responding on her own. Patient's daughter indicated that her father had been at [facility] for rehab in the past, as well as the patient's sister currently being admitted to the facility right now. Patient and patient's daughter both discussed how clean [facility] is and how happy the family has been with the care received there. Patient's daughter indicated understanding that there may need to be other options discussed if [facility] has no beds available tomorrow. Patient and patient's daughter indicated understanding of needing to meet qualifying stay criteria before admission for SNF.  Emotional Assessment Appearance:  Appears stated age Attitude/Demeanor/Rapport:    Affect (typically observed):  Appropriate Orientation:  Oriented to Self, Oriented to Place Alcohol / Substance use:  Not Applicable Psych involvement (Current and /or in the community):  No (Comment)  Discharge Needs  Concerns to be addressed:  Care Coordination Readmission within the last 30 days:  No Current discharge risk:  Lives alone, Physical Impairment Barriers to Discharge:  Continued Medical Work up, Insurance Authorization    M , LCSW 11/29/2016, 12:53 PM  

## 2016-11-29 NOTE — Care Management Note (Signed)
Case Management Note  Patient Details  Name: Laura Diaz MRN: [id number] Date of Birth: September 05, 1922  Subjective/Objective:                  Found down at home   Action/Plan: CM spoke with the patient at the bedside. The patient and her daughter state she plans to be discharged to SNF for rehab.   Expected Discharge Date:  (unknown)               Expected Discharge Plan:  Skilled Nursing Facility  In-House Referral:  Clinical Social Work  Discharge planning Services     Post Acute Care Choice:    Choice offered to:     DME Arranged:  N/A DME Agency:  NA  HH Arranged:  NA HH Agency:  NA  Status of Service:  Completed, signed off  If discussed at Long Length of Stay Meetings, dates discussed:    Additional Comments:  Antony HasteBennett, Laura Picker Harris, RN 11/29/2016, 1:01 PM

## 2016-11-29 NOTE — Progress Notes (Signed)
Pt now in 2nd degree Type 1 AV Block with Type 2 AV Block at times. HR has dropped to 30s several times this shift. Pt asymptomatic. VSS. EKG obtained. On-call MD Toniann FailKakrakandy paged and aware. Awaiting BMET and Mag results.

## 2016-11-29 NOTE — Progress Notes (Signed)
[facility] PROGRESS NOTE  Laura Diaz  ZOX:[id number] DOB: 02/01/1922 DOA: 11/27/2016 PCP: Laura FunkGriffin, John, MD  Brief Narrative: Laura Diaz is a 83 y.o. female with a history of CVA on coumadin for 20 years, vascular dementia, and macular degeneration who was found down at home with uncertain circumstances around the fall/duration down. In the ED, CT head and CT cervical spine showed no acute findings. Left hip XR unremarkable and subsequent left hip CT showed soft tissue contusion without fracture or dislocation. Left elbow XR showed a "tiny fracture along the posterior olecranon with overlying soft tissue swelling." Creatinine was 2.12, CK elevated at 4,949, and INR INR 6.5. UA concerning for UTI. IV fluids and antibiotics were given. Orthopedics consulted, recommending a sling and conservative care for elbow fracture. CK and SCr improved, though INR remains elevated. She has appeared very weak and imbalanced with physical therapy evaluation and SNF is recommended. She would be stable for discharge 11/5 if labs and exam continue improvement.   Subjective: Pain improved generally, no complaints. Had a hard time working with PT this morning. No new bleeding or bruising that she or family are aware of.  Objective: BP (!) 139/59 (BP Location: Left Arm)   Pulse (!) 52   Temp 98.1 F (36.7 C) (Oral)   Resp 18   Ht 5\' 5"  (1.651 m)   Wt 88.9 kg (195 lb 15.8 oz)   SpO2 98%   BMI 32.61 kg/m   Gen: Obese, elderly female in no distress, more alert today.  Pulm: Clear and nonlabored on room air  CV: RRR, no murmur, no JVD, no edema GI: Soft, NT, ND, +BS  Neuro: Alert and oriented today. No focal deficits. Ext: Warm, no visible deformities, though left elbow is only very mildly tender to palpation with preserved AROM. Left hip with tenderness to lateral palpation.  Skin: Ecchymoses are stable, particularly to right axilla and lateral right breast.   Assessment & Plan: Fall with  rhabdomyolysis - Continue IVF today, continue trending CK and metabolic panel (LFTs improving with bili now normal)  - Fall precautions - PT evaluation.    Supratherapeutic INR: Mild without active bleeding, resolving.   - Hold coumadin and monitor INR. No indication for vitamin K and do not want to unnecessarily put her at risk of CVA.  - Discussed with family, planning to switch to eliquis once INR in a more acceptable range.   AKI: Due to dehydration, rhabdomyolysis. Resolved. Kidney function would not be a barrier to DOAC. - Monitor with IVF's   UTI: Presumptive Dx.  - Continue ceftriaxone. Leukocytosis resolved.  - Unfortunately the urine culture was canceled and will not be available to inform decisions.   History of CVA, vascular dementia:  - Stable, holding statin and anticoagulation at this time as above.  - Delirium precautions.   Hypertension: At goal off medications.  - Prn hydralazine   FTT:  - I agree with physical therapist, patient would greatly benefit from SNF at discharge. CSW consulted. Should be stable for discharge 11/5 if no new bleeding.   Laura Junkeryan Deauna Yaw, MD [facility] Pager [phone]  If 7PM-7AM, please contact night-coverage [email or web address] Password [email or web address] 11/29/2016, 2:55 PM

## 2016-11-29 NOTE — NC FL2 (Signed)
[place] MEDICAID FL2 LEVEL OF CARE SCREENING TOOL     IDENTIFICATION  Patient Name: Laura Diaz Birthdate: [birth year] Sex: female Admission Date (Current Location): 11/27/2016  [facility] [place]   Facility and Address:  [facility],  [address], [place] [id number]      Provider Number: [id number]  Attending Physician Name and Address:  Tyrone NineGrunz, Ryan B, MD  Relative Name and Phone Number:       Current Level of Care: Hospital Recommended Level of Care: Skilled Nursing Facility Prior Approval Number:    Date Approved/Denied:   PASRR Number: [id number]  Discharge Plan: SNF    Current Diagnoses: Patient Active Problem List   Diagnosis Date Noted  . Supratherapeutic INR 11/28/2016  . History of CVA (cerebrovascular accident) 11/28/2016  . Vascular dementia 11/28/2016  . Rhabdomyolysis 11/28/2016  . Acute lower UTI 11/28/2016  . Fall 11/27/2016  . Encephalopathy 05/18/2014  . Chronic anticoagulation 05/18/2014  . Macular degeneration 05/18/2014  . Confusion with non-focal neuro exam   . Hyperlipidemia   . Stroke (HCC) 03/17/2014  . Delirium 03/16/2014  . Dementia 03/16/2014  . Encephalopathy acute 03/16/2014    Orientation RESPIRATION BLADDER Height & Weight     Self, Place  Normal External catheter(catheter placed 11/28/16) Weight: 195 lb 15.8 oz (88.9 kg) Height:  5\' 5"  (165.1 cm)  BEHAVIORAL SYMPTOMS/MOOD NEUROLOGICAL BOWEL NUTRITION STATUS      Incontinent    AMBULATORY STATUS COMMUNICATION OF NEEDS Skin   Extensive Assist Verbally Normal                       Personal Care Assistance Level of Assistance  Bathing, Feeding, Dressing Bathing Assistance: Maximum assistance Feeding assistance: Limited assistance Dressing Assistance: Maximum assistance     Functional Limitations Info  Sight Sight Info: Impaired        SPECIAL CARE FACTORS FREQUENCY  PT (By licensed PT), OT (By licensed OT)     PT  Frequency: 5x/wk OT Frequency: 5x/wk            Contractures      Additional Factors Info  Code Status, Allergies Code Status Info: DNR Allergies Info: Pollen Extract           Current Medications (11/29/2016):  This is the current hospital active medication list Current Facility-Administered Medications  Medication Dose Route Frequency Provider Last Rate Last Dose  . 0.9 %  sodium chloride infusion   Intravenous Continuous Tyrone NineGrunz, Ryan B, MD 75 mL/hr at 11/28/16 1300    . acetaminophen (TYLENOL) tablet 650 mg  650 mg Oral Daily PRN Albertine GratesXu, Fang, MD      . cefTRIAXone (ROCEPHIN) 1 g in dextrose 5 % 50 mL IVPB  1 g Intravenous Q24H Albertine GratesXu, Fang, MD   Stopped at 11/28/16 1806  . hydrALAZINE (APRESOLINE) injection 10 mg  10 mg Intravenous Q6H PRN Albertine GratesXu, Fang, MD      . tetrahydrozoline 0.05 % ophthalmic solution 1 drop  1 drop Right Eye Daily PRN Albertine GratesXu, Fang, MD   1 drop at 11/28/16 1737  . traMADol (ULTRAM) tablet 50 mg  50 mg Oral BID Albertine GratesXu, Fang, MD   50 mg at 11/29/16 [id number]  . traZODone (DESYREL) tablet 50 mg  50 mg Oral QHS Albertine GratesXu, Fang, MD   50 mg at 11/28/16 2132     Discharge Medications: Please see discharge summary for a list of discharge medications.  Relevant Imaging Results:  Relevant  Lab Results:   Additional Information SS#: [id number]  Baldemar Lenis, LCSW

## 2016-11-30 DIAGNOSIS — S52022D Displaced fracture of olecranon process without intraarticular extension of left ulna, subsequent encounter for closed fracture with routine healing: Secondary | ICD-10-CM | POA: Diagnosis not present

## 2016-11-30 DIAGNOSIS — M6281 Muscle weakness (generalized): Secondary | ICD-10-CM | POA: Diagnosis not present

## 2016-11-30 DIAGNOSIS — S7002XD Contusion of left hip, subsequent encounter: Secondary | ICD-10-CM | POA: Diagnosis not present

## 2016-11-30 DIAGNOSIS — S52022A Displaced fracture of olecranon process without intraarticular extension of left ulna, initial encounter for closed fracture: Secondary | ICD-10-CM | POA: Diagnosis not present

## 2016-11-30 DIAGNOSIS — S7002XA Contusion of left hip, initial encounter: Secondary | ICD-10-CM | POA: Diagnosis not present

## 2016-11-30 DIAGNOSIS — M6282 Rhabdomyolysis: Secondary | ICD-10-CM | POA: Diagnosis not present

## 2016-11-30 DIAGNOSIS — Z4789 Encounter for other orthopedic aftercare: Secondary | ICD-10-CM | POA: Diagnosis not present

## 2016-11-30 DIAGNOSIS — M9742XD Periprosthetic fracture around internal prosthetic left elbow joint, subsequent encounter: Secondary | ICD-10-CM | POA: Diagnosis not present

## 2016-11-30 DIAGNOSIS — Z7901 Long term (current) use of anticoagulants: Secondary | ICD-10-CM | POA: Diagnosis not present

## 2016-11-30 DIAGNOSIS — S31819D Unspecified open wound of right buttock, subsequent encounter: Secondary | ICD-10-CM | POA: Diagnosis not present

## 2016-11-30 DIAGNOSIS — S31000D Unspecified open wound of lower back and pelvis without penetration into retroperitoneum, subsequent encounter: Secondary | ICD-10-CM | POA: Diagnosis not present

## 2016-11-30 DIAGNOSIS — T796XXA Traumatic ischemia of muscle, initial encounter: Secondary | ICD-10-CM | POA: Diagnosis not present

## 2016-11-30 DIAGNOSIS — R41841 Cognitive communication deficit: Secondary | ICD-10-CM | POA: Diagnosis not present

## 2016-11-30 DIAGNOSIS — S31829D Unspecified open wound of left buttock, subsequent encounter: Secondary | ICD-10-CM | POA: Diagnosis not present

## 2016-11-30 DIAGNOSIS — R2681 Unsteadiness on feet: Secondary | ICD-10-CM | POA: Diagnosis not present

## 2016-11-30 DIAGNOSIS — N390 Urinary tract infection, site not specified: Secondary | ICD-10-CM | POA: Diagnosis not present

## 2016-11-30 DIAGNOSIS — Z9181 History of falling: Secondary | ICD-10-CM | POA: Diagnosis not present

## 2016-11-30 DIAGNOSIS — R278 Other lack of coordination: Secondary | ICD-10-CM | POA: Diagnosis not present

## 2016-11-30 DIAGNOSIS — R2689 Other abnormalities of gait and mobility: Secondary | ICD-10-CM | POA: Diagnosis not present

## 2016-11-30 LAB — CBC
HCT: 38.6 % (ref 36.0–46.0)
Hemoglobin: 12.7 g/dL (ref 12.0–15.0)
MCH: 31.4 pg (ref 26.0–34.0)
MCHC: 32.9 g/dL (ref 30.0–36.0)
MCV: 95.5 fL (ref 78.0–100.0)
Platelets: 236 10*3/uL (ref 150–400)
RBC: 4.04 MIL/uL (ref 3.87–5.11)
RDW: 13.6 % (ref 11.5–15.5)
WBC: 7.7 10*3/uL (ref 4.0–10.5)

## 2016-11-30 LAB — COMPREHENSIVE METABOLIC PANEL
ALT: 40 U/L (ref 14–54)
AST: 56 U/L — ABNORMAL HIGH (ref 15–41)
Albumin: 3 g/dL — ABNORMAL LOW (ref 3.5–5.0)
Alkaline Phosphatase: 84 U/L (ref 38–126)
Anion gap: 8 (ref 5–15)
BUN: 13 mg/dL (ref 6–20)
CO2: 26 mmol/L (ref 22–32)
Calcium: 8.7 mg/dL — ABNORMAL LOW (ref 8.9–10.3)
Chloride: 106 mmol/L (ref 101–111)
Creatinine, Ser: 0.87 mg/dL (ref 0.44–1.00)
GFR calc Af Amer: >60 mL/min (ref >60)
GFR calc non Af Amer: 56 mL/min — ABNORMAL LOW (ref >60)
Glucose, Bld: 114 mg/dL — ABNORMAL HIGH (ref 65–99)
Potassium: 4 mmol/L (ref 3.5–5.1)
Sodium: 140 mmol/L (ref 135–145)
Total Bilirubin: 1 mg/dL (ref 0.3–1.2)
Total Protein: 6 g/dL — ABNORMAL LOW (ref 6.5–8.1)

## 2016-11-30 LAB — PROTIME-INR
INR: 2.73
Prothrombin Time: 28.7 seconds — ABNORMAL HIGH (ref 11.4–15.2)

## 2016-11-30 LAB — CK: Total CK: 778 U/L — ABNORMAL HIGH (ref 38–234)

## 2016-11-30 MED ORDER — CEPHALEXIN 500 MG PO CAPS: 500.0000 mg | ORAL_CAPSULE | Freq: Two times a day (BID) | ORAL | 0 refills | Status: DC

## 2016-11-30 MED ORDER — TRAMADOL HCL 50 MG PO TABS: 50.0000 mg | ORAL_TABLET | Freq: Two times a day (BID) | ORAL | 0 refills | Status: DC

## 2016-11-30 NOTE — Discharge Instructions (Signed)
Information on my medicine - ELIQUIS (apixaban)  This medication education was reviewed with me or my healthcare representative as part of my discharge preparation.  The pharmacist that spoke with me during my hospital stay was:  Laverta BaltimoreMary Berlyn Malina, Student-PharmD  Why was Eliquis prescribed for you? Eliquis was prescribed for you to reduce the risk of a blood clot forming that can cause a stroke.  What do You need to know about Eliquis ? Take your Eliquis TWICE DAILY - one tablet in the morning and one tablet in the evening with or without food. If you have difficulty swallowing the tablet whole please discuss with your pharmacist how to take the medication safely.  Take Eliquis exactly as prescribed by your doctor and DO NOT stop taking Eliquis without talking to the doctor who prescribed the medication.  Stopping may increase your risk of developing a stroke.  Refill your prescription before you run out.  After discharge, you should have regular check-up appointments with your healthcare provider that is prescribing your Eliquis.  In the future your dose may need to be changed if your kidney function or weight changes by a significant amount or as you get older.  What do you do if you miss a dose? If you miss a dose, take it as soon as you remember on the same day and resume taking twice daily.  Do not take more than one dose of ELIQUIS at the same time to make up a missed dose.  Important Safety Information A possible side effect of Eliquis is bleeding. You should call your healthcare provider right away if you experience any of the following: ? Bleeding from an injury or your nose that does not stop. ? Unusual colored urine (red or dark brown) or unusual colored stools (red or black). ? Unusual bruising for unknown reasons. ? A serious fall or if you hit your head (even if there is no bleeding).  Some medicines may interact with Eliquis and might increase your risk of bleeding or  clotting while on Eliquis. To help avoid this, consult your healthcare provider or pharmacist prior to using any new prescription or non-prescription medications, including herbals, vitamins, non-steroidal anti-inflammatory drugs (NSAIDs) and supplements.  This website has more information on Eliquis (apixaban): http://www.eliquis.com/eliquis/home

## 2016-11-30 NOTE — Clinical Social Work Placement (Addendum)
Room 103A  Nurse call report to:[phone] [facility] scheduled for 1:00PM transport/ Daughter at bedside.  D/C Summary faxed.   CLINICAL SOCIAL WORK PLACEMENT  NOTE  Date:  11/30/2016  Patient Details  Name: Laura Diaz MRN: [id number] Date of Birth: February 08, 1922  Clinical Social Work is seeking post-discharge placement for this patient at the Skilled  Nursing Facility level of care (*CSW will initial, date and re-position this form in  chart as items are completed):  Yes   Patient/family provided with [facility] Clinical Social Work Department's list of facilities offering this level of care within the geographic area requested by the patient (or if unable, by the patient's family).  Yes   Patient/family informed of their freedom to choose among providers that offer the needed level of care, that participate in Medicare, Medicaid or managed care program needed by the patient, have an available bed and are willing to accept the patient.  Yes   Patient/family informed of [place]'s ownership interest in [facility] and [facility], as well as of the fact that they are under no obligation to receive care at these facilities.  PASRR submitted to EDS on       PASRR number received on       Existing PASRR number confirmed on       FL2 transmitted to all facilities in geographic area requested by pt/family on       FL2 transmitted to all facilities within larger geographic area on 11/30/16     Patient informed that his/her managed care company has contracts with or will negotiate with certain facilities, including the following:  [facility] [place]     Yes   Patient/family informed of bed offers received.  Patient chooses bed at [facility] [place]     Physician recommends and patient chooses bed at      Patient to be transferred to [facility] [place] on 11/30/16.  Patient to be transferred to facility by [facility]      Patient family notified on 11/30/16 of  transfer.  Name of family member notified:  Daughter-Donna      PHYSICIAN Please sign DNR, Please prepare priority discharge summary, including medications, Please prepare prescriptions     Additional Comment:    _______________________________________________ Clearance CootsNicole A Nikcole Eischeid, LCSW 11/30/2016, 10:24 AM

## 2016-11-30 NOTE — Care Management Note (Signed)
Case Management Note  Patient Details  Name: Laura Diaz MRN: [id number] Date of Birth: 1922-05-04  Subjective/Objective:d/c SNF-CSW managing.                    Action/Plan:d/c SNF.   Expected Discharge Date:  (unknown)               Expected Discharge Plan:  Skilled Nursing Facility  In-House Referral:  Clinical Social Work  Discharge planning Services     Post Acute Care Choice:    Choice offered to:     DME Arranged:  N/A DME Agency:  NA  HH Arranged:  NA HH Agency:  NA  Status of Service:  Completed, signed off  If discussed at Long Length of Stay Meetings, dates discussed:    Additional Comments:  Lanier ClamMahabir, Shareece Bultman, RN 11/30/2016, 11:01 AM

## 2016-11-30 NOTE — Discharge Summary (Signed)
Physician Discharge Summary  Laurren Lepkowski WUX:[id number] DOB: 1922-03-13 DOA: 11/27/2016  PCP: Kirby Funk, MD  Admit date: 11/27/2016 Discharge date: 11/30/2016  Admitted From: Home Disposition: SNF   Recommendations for Outpatient Follow-up:  1. Follow up with PCP in 1-2 weeks 2. Monitor INR, and recommend starting eliquis once INR <2.  3. Continue abx for UTI Tx  Home Health: N/A Equipment/Devices: Per SNF Discharge Condition: Stable CODE STATUS: DNR Diet recommendation: Regular  Brief/Interim Summary: Shalise Rosado Doddis a 83 y.o.femalewith a history of CVA on coumadin for 20 years, vascular dementia, and macular degeneration who was found down at home with uncertain circumstances around the fall/duration down. In the ED, CT head and CT cervical spine showed no acute findings.Left hip XR unremarkable and subsequent left hip CT showed soft tissue contusion without fracture or dislocation. Left elbow XR showed a "tiny fracture along the posterior olecranon with overlying soft tissue swelling." Creatinine was 2.12, CK elevated at 4,949, and INR INR 6.5. UA concerning for UTI. IV fluids and antibiotics were given. Orthopedics consulted, recommending a sling and conservative care for elbow fracture. CK and SCr improved, though INR remains elevated. She has appeared very weak and imbalanced with physical therapy evaluation and SNF is recommended.  Discharge Diagnoses:  Principal Problem:   Rhabdomyolysis Active Problems:   Chronic anticoagulation   Macular degeneration   Fall   Supratherapeutic INR   History of CVA (cerebrovascular accident)   Vascular dementia   Acute lower UTI  Fall with rhabdomyolysis: Continue PT at SNF, rhabdomyolysis resolving.  - Fall precautions  Supratherapeutic INR: Mild without active bleeding, resolving.   - Hold coumadin and monitor INR which is trending downward.  - Discussed with family, planning to switch to eliquis once INR < 2.   AKI:  Due to dehydration, rhabdomyolysis. Resolved. Kidney function would not be a barrier to DOAC.  UTI: Presumptive Dx.  - Ceftriaxone > keflex. Leukocytosis resolved.  - Unfortunately the urine culture was canceled and will not be available to inform decisions.   Mildly elevated AST: - Hold statin for now  History of CVA,vascular dementia:  - Stable, holding statin and anticoagulation at this time as above.  - Delirium precautions.   Hypertension: At goal off medications.  - Prn hydralazine only   FTT: - I agree with physical therapist, patient would greatly benefit from SNF at discharge. CSW consulted   Discharge Instructions Discharge Instructions    Increase activity slowly   Complete by:  As directed      Allergies as of 11/30/2016      Reactions   Pollen Extract    EXCESSIVE WATERING OF EYES      Medication List    STOP taking these medications   atorvastatin 20 MG tablet Commonly known as:  LIPITOR   warfarin 5 MG tablet Commonly known as:  COUMADIN     TAKE these medications   acetaminophen 325 MG tablet Commonly known as:  TYLENOL Take 650 mg by mouth daily as needed.   cephALEXin 500 MG capsule Commonly known as:  KEFLEX Take 1 capsule (500 mg total) 2 (two) times daily by mouth.   EYLEA 2 MG/0.05ML Soln Generic drug:  Aflibercept 1 drop by Intravitreal route. Into the right eye every 2 months at physician's office   MIDNITE PO Take 1 tablet by mouth at bedtime as needed.   tobramycin 0.3 % ophthalmic solution Commonly known as:  TOBREX Place 1 drop into the right eye every 4 (  four) hours. Use 1 day before and 2 days after procedure   traMADol 50 MG tablet Commonly known as:  ULTRAM Take 1 tablet (50 mg total) 2 (two) times daily by mouth.   traZODone 50 MG tablet Commonly known as:  DESYREL Take 50 mg by mouth at bedtime.   VISINE OP Place 1 drop into the right eye daily as needed (eye discomfort).       Contact information for  follow-up providers    Kirby Funk, MD Follow up.   Specialty:  Internal Medicine Contact information: [address] [place] [address] [phone]            Contact information for after-discharge care    Destination    [facility] Follow up.   Service:  Skilled Nursing Contact information: [address] [place] [address] [phone]                 Allergies  Allergen Reactions  . Pollen Extract     EXCESSIVE WATERING OF EYES    Consultations:  None  Procedures/Studies: Dg Elbow Complete Left  Result Date: 11/27/2016 CLINICAL DATA:  Acute left elbow pain following fall today. Initial encounter. EXAM: LEFT ELBOW - COMPLETE 3+ VIEW COMPARISON:  None. FINDINGS: A tiny bony fracture fragment along the posterior olecranon noted with overlying soft tissue swelling. No other fracture, subluxation or dislocation identified. There is no evidence of joint effusion. IMPRESSION: Tiny fracture along the posterior olecranon with overlying soft tissue swelling. Electronically Signed   By: Harmon Pier M.D.   On: 11/27/2016 14:13   Ct Head Wo Contrast  Result Date: 11/27/2016 CLINICAL DATA:  Patient and was found on the floor by daughter. Head and neck trauma. Patient takes Coumadin. EXAM: CT HEAD WITHOUT CONTRAST CT CERVICAL SPINE WITHOUT CONTRAST TECHNIQUE: Multidetector CT imaging of the head and cervical spine was performed following the standard protocol without intravenous contrast. Multiplanar CT image reconstructions of the cervical spine were also generated. COMPARISON:  03/16/2014 FINDINGS: CT HEAD FINDINGS BRAIN: There is sulcal and ventricular prominence consistent with superficial and central atrophy. No intraparenchymal hemorrhage, mass effect nor midline shift. Periventricular and subcortical white matter hypodensities consistent with chronic small vessel ischemic disease are identified. No acute large  vascular territory infarcts. No abnormal extra-axial fluid collections. Basal cisterns are not effaced and midline. VASCULAR: Mild to moderate calcific atherosclerosis of the carotid siphons. SKULL: No skull fracture. No significant scalp soft tissue swelling. SINUSES/ORBITS: The mastoid air-cells are clear. The included paranasal sinuses are well-aerated.The included ocular globes and orbital contents are non-suspicious. Bilateral lens surgery. OTHER: None. CT CERVICAL SPINE FINDINGS Alignment: There is reversal cervical lordosis with apex at C5-6 secondary to degenerative disc disease. Intact atlantodental interval and craniocervical relationship. Anterolisthesis of C3 on C4, C4 on C5 and C7 on T1 grade 1 likely on the basis of degenerative disc and facet arthropathy. Skull base and vertebrae: No acute fracture. No primary bone lesion or focal pathologic process. Soft tissues and spinal canal: No prevertebral fluid or swelling. No visible canal hematoma. Disc levels: No significant central canal stenosis. Moderate-to-marked disc flattening C5-6, C6-7 and moderate at C7-T1 and T1-T2. Bilateral multilevel degenerative facet arthropathy C3 through C7 bilaterally with mild bilateral C5-6 and C6-7 neural foraminal encroachment. Left C3-4 and bilateral uncovertebral joint osteoarthritis with spurring at C5-6 and C6-7. Upper chest: Moderate aortic atherosclerosis of the included aortic arch and origins of the great vessels. Mild thyromegaly with  retrosternal extension. No acute pulmonary abnormality at the apices. There is minimal scarring at the left lung apex. Other: None IMPRESSION: 1. Chronic moderate small vessel ischemic disease of periventricular and deep white matter. No acute intracranial abnormality. 2. No acute cervical spine fracture. 3. Cervical spondylosis with marked disc space narrowing C5-6 and C6-7. Associated bilateral neural foraminal mild encroachment from osteophytes at these levels. 4. Degenerative  anterolisthesis grade 1 of C3 on C4, C4 and C5 and C7 on T1. Electronically Signed   By: Tollie Eth M.D.   On: 11/27/2016 14:33   Ct Cervical Spine Wo Contrast  Result Date: 11/27/2016 CLINICAL DATA:  Patient and was found on the floor by daughter. Head and neck trauma. Patient takes Coumadin. EXAM: CT HEAD WITHOUT CONTRAST CT CERVICAL SPINE WITHOUT CONTRAST TECHNIQUE: Multidetector CT imaging of the head and cervical spine was performed following the standard protocol without intravenous contrast. Multiplanar CT image reconstructions of the cervical spine were also generated. COMPARISON:  03/16/2014 FINDINGS: CT HEAD FINDINGS BRAIN: There is sulcal and ventricular prominence consistent with superficial and central atrophy. No intraparenchymal hemorrhage, mass effect nor midline shift. Periventricular and subcortical white matter hypodensities consistent with chronic small vessel ischemic disease are identified. No acute large vascular territory infarcts. No abnormal extra-axial fluid collections. Basal cisterns are not effaced and midline. VASCULAR: Mild to moderate calcific atherosclerosis of the carotid siphons. SKULL: No skull fracture. No significant scalp soft tissue swelling. SINUSES/ORBITS: The mastoid air-cells are clear. The included paranasal sinuses are well-aerated.The included ocular globes and orbital contents are non-suspicious. Bilateral lens surgery. OTHER: None. CT CERVICAL SPINE FINDINGS Alignment: There is reversal cervical lordosis with apex at C5-6 secondary to degenerative disc disease. Intact atlantodental interval and craniocervical relationship. Anterolisthesis of C3 on C4, C4 on C5 and C7 on T1 grade 1 likely on the basis of degenerative disc and facet arthropathy. Skull base and vertebrae: No acute fracture. No primary bone lesion or focal pathologic process. Soft tissues and spinal canal: No prevertebral fluid or swelling. No visible canal hematoma. Disc levels: No significant  central canal stenosis. Moderate-to-marked disc flattening C5-6, C6-7 and moderate at C7-T1 and T1-T2. Bilateral multilevel degenerative facet arthropathy C3 through C7 bilaterally with mild bilateral C5-6 and C6-7 neural foraminal encroachment. Left C3-4 and bilateral uncovertebral joint osteoarthritis with spurring at C5-6 and C6-7. Upper chest: Moderate aortic atherosclerosis of the included aortic arch and origins of the great vessels. Mild thyromegaly with retrosternal extension. No acute pulmonary abnormality at the apices. There is minimal scarring at the left lung apex. Other: None IMPRESSION: 1. Chronic moderate small vessel ischemic disease of periventricular and deep white matter. No acute intracranial abnormality. 2. No acute cervical spine fracture. 3. Cervical spondylosis with marked disc space narrowing C5-6 and C6-7. Associated bilateral neural foraminal mild encroachment from osteophytes at these levels. 4. Degenerative anterolisthesis grade 1 of C3 on C4, C4 and C5 and C7 on T1. Electronically Signed   By: Tollie Eth M.D.   On: 11/27/2016 14:33   Ct Hip Left Wo Contrast  Result Date: 11/27/2016 CLINICAL DATA:  Pain after fall. EXAM: CT OF THE LEFT HIP WITHOUT CONTRAST TECHNIQUE: Multidetector CT imaging of the left hip was performed according to the standard protocol. Multiplanar CT image reconstructions were also generated. COMPARISON:  Same date left hip radiographs. FINDINGS: Bones/Joint/Cartilage No fracture of the left hip and included pelvis. No joint dislocation nor joint effusion. Ligaments Suboptimally assessed by CT. Muscles and Tendons No muscle or tendon tear.  No intra muscular atrophy or hemorrhage. Soft tissues Soft tissue contusion overlying the left ischial tuberosity. Sigmoid diverticulosis noted incidentally. IMPRESSION: 1. No acute left hip fracture, joint effusion nor dislocations 2. Soft tissue contusion overlying the left gluteus muscle and ischial tuberosity. 3. Sigmoid  diverticulosis. Electronically Signed   By: Tollie Ethavid  Kwon M.D.   On: 11/27/2016 16:29   Dg Hip Unilat W Or Wo Pelvis 2-3 Views Left  Result Date: 11/27/2016 CLINICAL DATA:  Acute left hip pain following fall today. Initial encounter. EXAM: DG HIP (WITH OR WITHOUT PELVIS) 2-3V LEFT COMPARISON:  None. FINDINGS: There is no evidence of hip fracture or dislocation. There is no evidence of arthropathy or other focal bone abnormality. IMPRESSION: Negative. Electronically Signed   By: Harmon PierJeffrey  Hu M.D.   On: 11/27/2016 14:12    Subjective: Feels better, muscle aches improved. Didn't sleep well but no bleeding, dysuria, fever. Feels weak.   Discharge Exam: Vitals:   11/29/16 2020 11/30/16 0458  BP: (!) 167/65 (!) 176/69  Pulse: (!) 58 (!) 56  Resp: 18 17  Temp: 98 F (36.7 C) 98.2 F (36.8 C)  SpO2: 95% 96%   Gen: Obese, elderly female in no distress, more alert today.  Pulm: Clear and nonlabored on room air  CV: RRR, no murmur, no JVD, no edema GI: Soft, NT, ND, +BS  Neuro: Alert and oriented today. No focal deficits. Ext: Warm, no visible deformities, though left elbow is only very mildly tender to palpation with preserved AROM. Left hip with tenderness to lateral palpation.  Skin: Ecchymoses are stable, particularly to right axilla and lateral right breast.  Labs: Basic Metabolic Panel: Recent Labs  Lab 11/27/16 1320 11/28/16 0605 11/29/16 0607 11/29/16 2111 11/30/16 0516  NA 142 141 140 139 140  K 4.4 3.8 3.7 4.0 4.0  CL 105 108 110 107 106  CO2 25 24 23 25 26   GLUCOSE 139* 100* 104* 145* 114*  BUN 30* 33* 20 16 13   CREATININE 2.12* 1.58* 0.89 0.82 0.87  CALCIUM 9.7 8.5* 8.4* 8.3* 8.7*  MG  --   --   --  1.7  --    Liver Function Tests: Recent Labs  Lab 11/27/16 1320 11/28/16 0605 11/29/16 0607 11/30/16 0516  AST 113* 84* 68* 56*  ALT 43 35 37 40  ALKPHOS 97 72 74 84  BILITOT 2.2* 1.4* 1.0 1.0  PROT 7.1 5.6* 5.5* 6.0*  ALBUMIN 4.1 3.1* 3.0* 3.0*   CBC: Recent  Labs  Lab 11/27/16 1320 11/28/16 0605 11/30/16 0516  WBC 11.0* 8.9 7.7  NEUTROABS 9.3*  --   --   HGB 14.7 11.9* 12.7  HCT 43.8 36.9 38.6  MCV 95.6 96.3 95.5  PLT 253 214 236   Cardiac Enzymes: Recent Labs  Lab 11/27/16 1320 11/28/16 0605 11/29/16 0607 11/30/16 0516  CKTOTAL 4,949* 2,938* 1,626* 778*   Urinalysis    Component Value Date/Time   COLORURINE AMBER (A) 11/27/2016 1509   APPEARANCEUR CLOUDY (A) 11/27/2016 1509   LABSPEC 1.023 11/27/2016 1509   PHURINE 5.0 11/27/2016 1509   GLUCOSEU NEGATIVE 11/27/2016 1509   HGBUR LARGE (A) 11/27/2016 1509   BILIRUBINUR NEGATIVE 11/27/2016 1509   KETONESUR 5 (A) 11/27/2016 1509   PROTEINUR 100 (A) 11/27/2016 1509   UROBILINOGEN 0.2 03/16/2014 1843   NITRITE NEGATIVE 11/27/2016 1509   LEUKOCYTESUR SMALL (A) 11/27/2016 1509   Time coordinating discharge: Approximately 40 minutes  Hazeline Junkeryan Julee Stoll, MD  [facility] 11/30/2016, 11:03 AM Pager [phone]

## 2016-11-30 NOTE — Progress Notes (Signed)
Patient discharged to [facility] via ambulance. Discharge Packet prepared by CSW and given to EMS for facilty. Report called to Melissa McCoy-LPN. Patient's daughter at the bedside during transportation. Skin with abrasions/bruises from fall at home, no pressure ulcer noted.  Patient denies any pain/distress.

## 2016-12-01 DIAGNOSIS — S31819D Unspecified open wound of right buttock, subsequent encounter: Secondary | ICD-10-CM | POA: Diagnosis not present

## 2016-12-01 DIAGNOSIS — S31829D Unspecified open wound of left buttock, subsequent encounter: Secondary | ICD-10-CM | POA: Diagnosis not present

## 2016-12-01 DIAGNOSIS — S31000D Unspecified open wound of lower back and pelvis without penetration into retroperitoneum, subsequent encounter: Secondary | ICD-10-CM | POA: Diagnosis not present

## 2016-12-06 DIAGNOSIS — M9742XD Periprosthetic fracture around internal prosthetic left elbow joint, subsequent encounter: Secondary | ICD-10-CM | POA: Diagnosis not present

## 2016-12-06 DIAGNOSIS — R2689 Other abnormalities of gait and mobility: Secondary | ICD-10-CM | POA: Diagnosis not present

## 2016-12-06 DIAGNOSIS — N390 Urinary tract infection, site not specified: Secondary | ICD-10-CM | POA: Diagnosis not present

## 2016-12-06 DIAGNOSIS — M6282 Rhabdomyolysis: Secondary | ICD-10-CM | POA: Diagnosis not present

## 2016-12-08 DIAGNOSIS — S31829D Unspecified open wound of left buttock, subsequent encounter: Secondary | ICD-10-CM | POA: Diagnosis not present

## 2016-12-08 DIAGNOSIS — S31819D Unspecified open wound of right buttock, subsequent encounter: Secondary | ICD-10-CM | POA: Diagnosis not present

## 2016-12-08 DIAGNOSIS — S31000D Unspecified open wound of lower back and pelvis without penetration into retroperitoneum, subsequent encounter: Secondary | ICD-10-CM | POA: Diagnosis not present

## 2016-12-14 DIAGNOSIS — S31829D Unspecified open wound of left buttock, subsequent encounter: Secondary | ICD-10-CM | POA: Diagnosis not present

## 2016-12-14 DIAGNOSIS — S31000D Unspecified open wound of lower back and pelvis without penetration into retroperitoneum, subsequent encounter: Secondary | ICD-10-CM | POA: Diagnosis not present

## 2016-12-14 DIAGNOSIS — S31819D Unspecified open wound of right buttock, subsequent encounter: Secondary | ICD-10-CM | POA: Diagnosis not present

## 2016-12-29 DIAGNOSIS — F325 Major depressive disorder, single episode, in full remission: Secondary | ICD-10-CM | POA: Diagnosis not present

## 2016-12-29 DIAGNOSIS — N183 Chronic kidney disease, stage 3 (moderate): Secondary | ICD-10-CM | POA: Diagnosis not present

## 2016-12-29 DIAGNOSIS — E7800 Pure hypercholesterolemia, unspecified: Secondary | ICD-10-CM | POA: Diagnosis not present

## 2016-12-29 DIAGNOSIS — F0390 Unspecified dementia without behavioral disturbance: Secondary | ICD-10-CM | POA: Diagnosis not present

## 2016-12-29 DIAGNOSIS — Z7901 Long term (current) use of anticoagulants: Secondary | ICD-10-CM | POA: Diagnosis not present

## 2016-12-29 DIAGNOSIS — G2581 Restless legs syndrome: Secondary | ICD-10-CM | POA: Diagnosis not present

## 2016-12-29 DIAGNOSIS — Z7189 Other specified counseling: Secondary | ICD-10-CM | POA: Diagnosis not present

## 2016-12-31 DIAGNOSIS — F0150 Vascular dementia without behavioral disturbance: Secondary | ICD-10-CM | POA: Diagnosis not present

## 2016-12-31 DIAGNOSIS — I69318 Other symptoms and signs involving cognitive functions following cerebral infarction: Secondary | ICD-10-CM | POA: Diagnosis not present

## 2016-12-31 DIAGNOSIS — I129 Hypertensive chronic kidney disease with stage 1 through stage 4 chronic kidney disease, or unspecified chronic kidney disease: Secondary | ICD-10-CM | POA: Diagnosis not present

## 2016-12-31 DIAGNOSIS — D631 Anemia in chronic kidney disease: Secondary | ICD-10-CM | POA: Diagnosis not present

## 2016-12-31 DIAGNOSIS — S52502D Unspecified fracture of the lower end of left radius, subsequent encounter for closed fracture with routine healing: Secondary | ICD-10-CM | POA: Diagnosis not present

## 2016-12-31 DIAGNOSIS — N183 Chronic kidney disease, stage 3 (moderate): Secondary | ICD-10-CM | POA: Diagnosis not present

## 2017-01-07 DIAGNOSIS — H43813 Vitreous degeneration, bilateral: Secondary | ICD-10-CM | POA: Diagnosis not present

## 2017-01-07 DIAGNOSIS — H353211 Exudative age-related macular degeneration, right eye, with active choroidal neovascularization: Secondary | ICD-10-CM | POA: Diagnosis not present

## 2017-01-07 DIAGNOSIS — H353223 Exudative age-related macular degeneration, left eye, with inactive scar: Secondary | ICD-10-CM | POA: Diagnosis not present

## 2017-01-11 DIAGNOSIS — N183 Chronic kidney disease, stage 3 (moderate): Secondary | ICD-10-CM | POA: Diagnosis not present

## 2017-01-11 DIAGNOSIS — I129 Hypertensive chronic kidney disease with stage 1 through stage 4 chronic kidney disease, or unspecified chronic kidney disease: Secondary | ICD-10-CM | POA: Diagnosis not present

## 2017-01-11 DIAGNOSIS — F0150 Vascular dementia without behavioral disturbance: Secondary | ICD-10-CM | POA: Diagnosis not present

## 2017-01-11 DIAGNOSIS — I69318 Other symptoms and signs involving cognitive functions following cerebral infarction: Secondary | ICD-10-CM | POA: Diagnosis not present

## 2017-01-11 DIAGNOSIS — S52502D Unspecified fracture of the lower end of left radius, subsequent encounter for closed fracture with routine healing: Secondary | ICD-10-CM | POA: Diagnosis not present

## 2017-01-11 DIAGNOSIS — D631 Anemia in chronic kidney disease: Secondary | ICD-10-CM | POA: Diagnosis not present

## 2017-01-12 DIAGNOSIS — N183 Chronic kidney disease, stage 3 (moderate): Secondary | ICD-10-CM | POA: Diagnosis not present

## 2017-01-12 DIAGNOSIS — D631 Anemia in chronic kidney disease: Secondary | ICD-10-CM | POA: Diagnosis not present

## 2017-01-12 DIAGNOSIS — I69318 Other symptoms and signs involving cognitive functions following cerebral infarction: Secondary | ICD-10-CM | POA: Diagnosis not present

## 2017-01-12 DIAGNOSIS — S52502D Unspecified fracture of the lower end of left radius, subsequent encounter for closed fracture with routine healing: Secondary | ICD-10-CM | POA: Diagnosis not present

## 2017-01-12 DIAGNOSIS — I129 Hypertensive chronic kidney disease with stage 1 through stage 4 chronic kidney disease, or unspecified chronic kidney disease: Secondary | ICD-10-CM | POA: Diagnosis not present

## 2017-01-12 DIAGNOSIS — F0150 Vascular dementia without behavioral disturbance: Secondary | ICD-10-CM | POA: Diagnosis not present

## 2017-01-18 DIAGNOSIS — I129 Hypertensive chronic kidney disease with stage 1 through stage 4 chronic kidney disease, or unspecified chronic kidney disease: Secondary | ICD-10-CM | POA: Diagnosis not present

## 2017-01-18 DIAGNOSIS — I69318 Other symptoms and signs involving cognitive functions following cerebral infarction: Secondary | ICD-10-CM | POA: Diagnosis not present

## 2017-01-18 DIAGNOSIS — F0150 Vascular dementia without behavioral disturbance: Secondary | ICD-10-CM | POA: Diagnosis not present

## 2017-01-18 DIAGNOSIS — N183 Chronic kidney disease, stage 3 (moderate): Secondary | ICD-10-CM | POA: Diagnosis not present

## 2017-01-18 DIAGNOSIS — S52502D Unspecified fracture of the lower end of left radius, subsequent encounter for closed fracture with routine healing: Secondary | ICD-10-CM | POA: Diagnosis not present

## 2017-01-18 DIAGNOSIS — D631 Anemia in chronic kidney disease: Secondary | ICD-10-CM | POA: Diagnosis not present

## 2017-01-20 DIAGNOSIS — I129 Hypertensive chronic kidney disease with stage 1 through stage 4 chronic kidney disease, or unspecified chronic kidney disease: Secondary | ICD-10-CM | POA: Diagnosis not present

## 2017-01-20 DIAGNOSIS — S52502D Unspecified fracture of the lower end of left radius, subsequent encounter for closed fracture with routine healing: Secondary | ICD-10-CM | POA: Diagnosis not present

## 2017-01-20 DIAGNOSIS — N183 Chronic kidney disease, stage 3 (moderate): Secondary | ICD-10-CM | POA: Diagnosis not present

## 2017-01-20 DIAGNOSIS — F0150 Vascular dementia without behavioral disturbance: Secondary | ICD-10-CM | POA: Diagnosis not present

## 2017-01-20 DIAGNOSIS — I69318 Other symptoms and signs involving cognitive functions following cerebral infarction: Secondary | ICD-10-CM | POA: Diagnosis not present

## 2017-01-20 DIAGNOSIS — D631 Anemia in chronic kidney disease: Secondary | ICD-10-CM | POA: Diagnosis not present

## 2017-01-25 DIAGNOSIS — I129 Hypertensive chronic kidney disease with stage 1 through stage 4 chronic kidney disease, or unspecified chronic kidney disease: Secondary | ICD-10-CM | POA: Diagnosis not present

## 2017-01-25 DIAGNOSIS — N183 Chronic kidney disease, stage 3 (moderate): Secondary | ICD-10-CM | POA: Diagnosis not present

## 2017-01-25 DIAGNOSIS — S52502D Unspecified fracture of the lower end of left radius, subsequent encounter for closed fracture with routine healing: Secondary | ICD-10-CM | POA: Diagnosis not present

## 2017-01-25 DIAGNOSIS — D631 Anemia in chronic kidney disease: Secondary | ICD-10-CM | POA: Diagnosis not present

## 2017-01-25 DIAGNOSIS — F0150 Vascular dementia without behavioral disturbance: Secondary | ICD-10-CM | POA: Diagnosis not present

## 2017-01-25 DIAGNOSIS — I69318 Other symptoms and signs involving cognitive functions following cerebral infarction: Secondary | ICD-10-CM | POA: Diagnosis not present

## 2017-01-27 DIAGNOSIS — I69318 Other symptoms and signs involving cognitive functions following cerebral infarction: Secondary | ICD-10-CM | POA: Diagnosis not present

## 2017-01-27 DIAGNOSIS — F0150 Vascular dementia without behavioral disturbance: Secondary | ICD-10-CM | POA: Diagnosis not present

## 2017-01-27 DIAGNOSIS — S52502D Unspecified fracture of the lower end of left radius, subsequent encounter for closed fracture with routine healing: Secondary | ICD-10-CM | POA: Diagnosis not present

## 2017-01-27 DIAGNOSIS — I129 Hypertensive chronic kidney disease with stage 1 through stage 4 chronic kidney disease, or unspecified chronic kidney disease: Secondary | ICD-10-CM | POA: Diagnosis not present

## 2017-01-27 DIAGNOSIS — D631 Anemia in chronic kidney disease: Secondary | ICD-10-CM | POA: Diagnosis not present

## 2017-01-27 DIAGNOSIS — N183 Chronic kidney disease, stage 3 (moderate): Secondary | ICD-10-CM | POA: Diagnosis not present

## 2017-01-29 DIAGNOSIS — Z0000 Encounter for general adult medical examination without abnormal findings: Secondary | ICD-10-CM | POA: Diagnosis not present

## 2017-01-29 DIAGNOSIS — R030 Elevated blood-pressure reading, without diagnosis of hypertension: Secondary | ICD-10-CM | POA: Diagnosis not present

## 2017-01-29 DIAGNOSIS — F325 Major depressive disorder, single episode, in full remission: Secondary | ICD-10-CM | POA: Diagnosis not present

## 2017-01-29 DIAGNOSIS — Z1331 Encounter for screening for depression: Secondary | ICD-10-CM | POA: Diagnosis not present

## 2017-02-01 DIAGNOSIS — I69318 Other symptoms and signs involving cognitive functions following cerebral infarction: Secondary | ICD-10-CM | POA: Diagnosis not present

## 2017-02-01 DIAGNOSIS — D631 Anemia in chronic kidney disease: Secondary | ICD-10-CM | POA: Diagnosis not present

## 2017-02-01 DIAGNOSIS — I129 Hypertensive chronic kidney disease with stage 1 through stage 4 chronic kidney disease, or unspecified chronic kidney disease: Secondary | ICD-10-CM | POA: Diagnosis not present

## 2017-02-01 DIAGNOSIS — N183 Chronic kidney disease, stage 3 (moderate): Secondary | ICD-10-CM | POA: Diagnosis not present

## 2017-02-01 DIAGNOSIS — S52502D Unspecified fracture of the lower end of left radius, subsequent encounter for closed fracture with routine healing: Secondary | ICD-10-CM | POA: Diagnosis not present

## 2017-02-01 DIAGNOSIS — F0150 Vascular dementia without behavioral disturbance: Secondary | ICD-10-CM | POA: Diagnosis not present

## 2017-02-03 DIAGNOSIS — I69318 Other symptoms and signs involving cognitive functions following cerebral infarction: Secondary | ICD-10-CM | POA: Diagnosis not present

## 2017-02-03 DIAGNOSIS — I129 Hypertensive chronic kidney disease with stage 1 through stage 4 chronic kidney disease, or unspecified chronic kidney disease: Secondary | ICD-10-CM | POA: Diagnosis not present

## 2017-02-03 DIAGNOSIS — N183 Chronic kidney disease, stage 3 (moderate): Secondary | ICD-10-CM | POA: Diagnosis not present

## 2017-02-03 DIAGNOSIS — D631 Anemia in chronic kidney disease: Secondary | ICD-10-CM | POA: Diagnosis not present

## 2017-02-03 DIAGNOSIS — F0150 Vascular dementia without behavioral disturbance: Secondary | ICD-10-CM | POA: Diagnosis not present

## 2017-02-03 DIAGNOSIS — S52502D Unspecified fracture of the lower end of left radius, subsequent encounter for closed fracture with routine healing: Secondary | ICD-10-CM | POA: Diagnosis not present

## 2017-02-16 DIAGNOSIS — N183 Chronic kidney disease, stage 3 (moderate): Secondary | ICD-10-CM | POA: Diagnosis not present

## 2017-02-16 DIAGNOSIS — F325 Major depressive disorder, single episode, in full remission: Secondary | ICD-10-CM | POA: Diagnosis not present

## 2017-02-16 DIAGNOSIS — Z7901 Long term (current) use of anticoagulants: Secondary | ICD-10-CM | POA: Diagnosis not present

## 2017-02-16 DIAGNOSIS — R197 Diarrhea, unspecified: Secondary | ICD-10-CM | POA: Diagnosis not present

## 2017-02-16 DIAGNOSIS — F0390 Unspecified dementia without behavioral disturbance: Secondary | ICD-10-CM | POA: Diagnosis not present

## 2017-02-16 DIAGNOSIS — G2581 Restless legs syndrome: Secondary | ICD-10-CM | POA: Diagnosis not present

## 2017-03-16 DIAGNOSIS — H353223 Exudative age-related macular degeneration, left eye, with inactive scar: Secondary | ICD-10-CM | POA: Diagnosis not present

## 2017-03-16 DIAGNOSIS — H43813 Vitreous degeneration, bilateral: Secondary | ICD-10-CM | POA: Diagnosis not present

## 2017-03-16 DIAGNOSIS — H353211 Exudative age-related macular degeneration, right eye, with active choroidal neovascularization: Secondary | ICD-10-CM | POA: Diagnosis not present

## 2017-05-25 DIAGNOSIS — H43813 Vitreous degeneration, bilateral: Secondary | ICD-10-CM | POA: Diagnosis not present

## 2017-05-25 DIAGNOSIS — H353211 Exudative age-related macular degeneration, right eye, with active choroidal neovascularization: Secondary | ICD-10-CM | POA: Diagnosis not present

## 2017-05-25 DIAGNOSIS — H353223 Exudative age-related macular degeneration, left eye, with inactive scar: Secondary | ICD-10-CM | POA: Diagnosis not present

## 2017-06-21 ENCOUNTER — Other Ambulatory Visit: Payer: Self-pay

## 2017-06-21 ENCOUNTER — Other Ambulatory Visit (HOSPITAL_COMMUNITY): Payer: Self-pay

## 2017-06-22 ENCOUNTER — Inpatient Hospital Stay (HOSPITAL_COMMUNITY)
Admission: EM | Admit: 2017-06-22 | Discharge: 2017-06-25 | DRG: 291 | Disposition: A | Discharge status: Home Health | Payer: Medicare Other | Attending: Family Medicine | Admitting: Family Medicine

## 2017-06-22 ENCOUNTER — Encounter (HOSPITAL_COMMUNITY): Payer: Self-pay

## 2017-06-22 ENCOUNTER — Emergency Department (HOSPITAL_COMMUNITY): Payer: Medicare Other

## 2017-06-22 DIAGNOSIS — J9601 Acute respiratory failure with hypoxia: Secondary | ICD-10-CM | POA: Diagnosis present

## 2017-06-22 DIAGNOSIS — Z7901 Long term (current) use of anticoagulants: Secondary | ICD-10-CM | POA: Diagnosis not present

## 2017-06-22 DIAGNOSIS — I340 Nonrheumatic mitral (valve) insufficiency: Secondary | ICD-10-CM | POA: Diagnosis present

## 2017-06-22 DIAGNOSIS — R062 Wheezing: Secondary | ICD-10-CM | POA: Diagnosis not present

## 2017-06-22 DIAGNOSIS — R001 Bradycardia, unspecified: Secondary | ICD-10-CM | POA: Diagnosis present

## 2017-06-22 DIAGNOSIS — Z90711 Acquired absence of uterus with remaining cervical stump: Secondary | ICD-10-CM

## 2017-06-22 DIAGNOSIS — I272 Pulmonary hypertension, unspecified: Secondary | ICD-10-CM | POA: Diagnosis present

## 2017-06-22 DIAGNOSIS — I509 Heart failure, unspecified: Secondary | ICD-10-CM

## 2017-06-22 DIAGNOSIS — Z66 Do not resuscitate: Secondary | ICD-10-CM | POA: Diagnosis present

## 2017-06-22 DIAGNOSIS — I5033 Acute on chronic diastolic (congestive) heart failure: Secondary | ICD-10-CM

## 2017-06-22 DIAGNOSIS — N179 Acute kidney failure, unspecified: Secondary | ICD-10-CM | POA: Diagnosis present

## 2017-06-22 DIAGNOSIS — H9190 Unspecified hearing loss, unspecified ear: Secondary | ICD-10-CM | POA: Diagnosis present

## 2017-06-22 DIAGNOSIS — Z87891 Personal history of nicotine dependence: Secondary | ICD-10-CM | POA: Diagnosis not present

## 2017-06-22 DIAGNOSIS — I361 Nonrheumatic tricuspid (valve) insufficiency: Secondary | ICD-10-CM | POA: Diagnosis not present

## 2017-06-22 DIAGNOSIS — Z8249 Family history of ischemic heart disease and other diseases of the circulatory system: Secondary | ICD-10-CM

## 2017-06-22 DIAGNOSIS — R030 Elevated blood-pressure reading, without diagnosis of hypertension: Secondary | ICD-10-CM | POA: Diagnosis present

## 2017-06-22 DIAGNOSIS — Z820 Family history of epilepsy and other diseases of the nervous system: Secondary | ICD-10-CM | POA: Diagnosis not present

## 2017-06-22 DIAGNOSIS — H3530 Unspecified macular degeneration: Secondary | ICD-10-CM | POA: Diagnosis present

## 2017-06-22 DIAGNOSIS — N289 Disorder of kidney and ureter, unspecified: Secondary | ICD-10-CM | POA: Diagnosis not present

## 2017-06-22 DIAGNOSIS — Z8673 Personal history of transient ischemic attack (TIA), and cerebral infarction without residual deficits: Secondary | ICD-10-CM | POA: Diagnosis not present

## 2017-06-22 DIAGNOSIS — F0150 Vascular dementia without behavioral disturbance: Secondary | ICD-10-CM | POA: Diagnosis present

## 2017-06-22 DIAGNOSIS — Z9181 History of falling: Secondary | ICD-10-CM | POA: Diagnosis not present

## 2017-06-22 DIAGNOSIS — Z823 Family history of stroke: Secondary | ICD-10-CM | POA: Diagnosis not present

## 2017-06-22 DIAGNOSIS — I4819 Other persistent atrial fibrillation: Secondary | ICD-10-CM | POA: Diagnosis present

## 2017-06-22 DIAGNOSIS — R600 Localized edema: Secondary | ICD-10-CM | POA: Diagnosis not present

## 2017-06-22 DIAGNOSIS — Q211 Atrial septal defect: Secondary | ICD-10-CM

## 2017-06-22 DIAGNOSIS — I499 Cardiac arrhythmia, unspecified: Secondary | ICD-10-CM | POA: Diagnosis not present

## 2017-06-22 DIAGNOSIS — Z803 Family history of malignant neoplasm of breast: Secondary | ICD-10-CM | POA: Diagnosis not present

## 2017-06-22 DIAGNOSIS — I5032 Chronic diastolic (congestive) heart failure: Secondary | ICD-10-CM

## 2017-06-22 DIAGNOSIS — R0602 Shortness of breath: Secondary | ICD-10-CM | POA: Diagnosis not present

## 2017-06-22 DIAGNOSIS — Z79899 Other long term (current) drug therapy: Secondary | ICD-10-CM

## 2017-06-22 DIAGNOSIS — I4891 Unspecified atrial fibrillation: Secondary | ICD-10-CM | POA: Diagnosis not present

## 2017-06-22 DIAGNOSIS — R5381 Other malaise: Secondary | ICD-10-CM | POA: Diagnosis present

## 2017-06-22 HISTORY — DX: Chronic diastolic (congestive) heart failure: I50.32

## 2017-06-22 HISTORY — DX: Other persistent atrial fibrillation: I48.19

## 2017-06-22 LAB — BRAIN NATRIURETIC PEPTIDE: B Natriuretic Peptide: 1577 pg/mL — ABNORMAL HIGH (ref 0.0–100.0)

## 2017-06-22 LAB — CBC WITH DIFFERENTIAL/PLATELET
Abs Immature Granulocytes: 0 10*3/uL (ref 0.0–0.1)
Basophils Absolute: 0 10*3/uL (ref 0.0–0.1)
Basophils Relative: 0 %
Eosinophils Absolute: 0 10*3/uL (ref 0.0–0.7)
Eosinophils Relative: 0 %
HCT: 46.1 % — ABNORMAL HIGH (ref 36.0–46.0)
Hemoglobin: 14.8 g/dL (ref 12.0–15.0)
Immature Granulocytes: 0 %
Lymphocytes Relative: 10 %
Lymphs Abs: 0.8 10*3/uL (ref 0.7–4.0)
MCH: 30.9 pg (ref 26.0–34.0)
MCHC: 32.1 g/dL (ref 30.0–36.0)
MCV: 96.2 fL (ref 78.0–100.0)
Monocytes Absolute: 0.7 10*3/uL (ref 0.1–1.0)
Monocytes Relative: 9 %
Neutro Abs: 6.2 10*3/uL (ref 1.7–7.7)
Neutrophils Relative %: 81 %
Platelets: 264 10*3/uL (ref 150–400)
RBC: 4.79 MIL/uL (ref 3.87–5.11)
RDW: 13.6 % (ref 11.5–15.5)
WBC: 7.7 10*3/uL (ref 4.0–10.5)

## 2017-06-22 LAB — I-STAT TROPONIN, ED: Troponin i, poc: 0.06 ng/mL (ref 0.00–0.08)

## 2017-06-22 LAB — COMPREHENSIVE METABOLIC PANEL
ALT: 44 U/L (ref 14–54)
AST: 39 U/L (ref 15–41)
Albumin: 3.5 g/dL (ref 3.5–5.0)
Alkaline Phosphatase: 68 U/L (ref 38–126)
Anion gap: 11 (ref 5–15)
BUN: 20 mg/dL (ref 6–20)
CO2: 22 mmol/L (ref 22–32)
Calcium: 9.1 mg/dL (ref 8.9–10.3)
Chloride: 111 mmol/L (ref 101–111)
Creatinine, Ser: 1.23 mg/dL — ABNORMAL HIGH (ref 0.44–1.00)
GFR calc Af Amer: 42 mL/min — ABNORMAL LOW (ref >60)
GFR calc non Af Amer: 37 mL/min — ABNORMAL LOW (ref >60)
Glucose, Bld: 100 mg/dL — ABNORMAL HIGH (ref 65–99)
Potassium: 4.6 mmol/L (ref 3.5–5.1)
Sodium: 144 mmol/L (ref 135–145)
Total Bilirubin: 1.4 mg/dL — ABNORMAL HIGH (ref 0.3–1.2)
Total Protein: 5.9 g/dL — ABNORMAL LOW (ref 6.5–8.1)

## 2017-06-22 LAB — URINALYSIS, ROUTINE W REFLEX MICROSCOPIC
Bacteria, UA: NONE SEEN
Bilirubin Urine: NEGATIVE
Glucose, UA: NEGATIVE mg/dL
Ketones, ur: NEGATIVE mg/dL
Leukocytes, UA: NEGATIVE
Nitrite: NEGATIVE
Protein, ur: NEGATIVE mg/dL
Specific Gravity, Urine: 1.009 (ref 1.005–1.030)
pH: 6 (ref 5.0–8.0)

## 2017-06-22 LAB — TSH: TSH: 6.628 u[IU]/mL — ABNORMAL HIGH (ref 0.350–4.500)

## 2017-06-22 LAB — MAGNESIUM: Magnesium: 2.2 mg/dL (ref 1.7–2.4)

## 2017-06-22 MED ORDER — ACETAMINOPHEN 325 MG PO TABS: 650.0000 mg | ORAL_TABLET | ORAL | Status: DC | PRN

## 2017-06-22 MED ORDER — SODIUM CHLORIDE 0.9% FLUSH: 3.0000 mL | INTRAVENOUS | Status: DC | PRN

## 2017-06-22 MED ORDER — HYPROMELLOSE (GONIOSCOPIC) 2.5 % OP SOLN: 1.0000 [drp] | Freq: Every day | OPHTHALMIC | Status: DC | PRN

## 2017-06-22 MED ORDER — ROPINIROLE HCL 1 MG PO TABS
5.0000 mg | ORAL_TABLET | Freq: Every day | ORAL | Status: DC
  Administered 2017-06-22 – 2017-06-24 (×3): 5 mg via ORAL
  Filled 2017-06-22 (×4): qty 5

## 2017-06-22 MED ORDER — APIXABAN 5 MG PO TABS
5.0000 mg | ORAL_TABLET | Freq: Two times a day (BID) | ORAL | Status: DC
  Administered 2017-06-22 – 2017-06-25 (×6): 5 mg via ORAL
  Filled 2017-06-22 (×6): qty 1

## 2017-06-22 MED ORDER — SODIUM CHLORIDE 0.9% FLUSH
3.0000 mL | Freq: Two times a day (BID) | INTRAVENOUS | Status: DC
  Administered 2017-06-22 – 2017-06-23 (×2): 3 mL via INTRAVENOUS
  Administered 2017-06-23: 10 mL via INTRAVENOUS
  Administered 2017-06-24 (×2): 3 mL via INTRAVENOUS

## 2017-06-22 MED ORDER — SODIUM CHLORIDE 0.9 % IV SOLN: 250.0000 mL | INTRAVENOUS | Status: DC | PRN

## 2017-06-22 MED ORDER — ONDANSETRON HCL 4 MG/2ML IJ SOLN: 4.0000 mg | Freq: Four times a day (QID) | INTRAMUSCULAR | Status: DC | PRN

## 2017-06-22 MED ORDER — ALPRAZOLAM 0.25 MG PO TABS
0.2500 mg | ORAL_TABLET | Freq: Two times a day (BID) | ORAL | Status: DC | PRN
  Filled 2017-06-22: qty 1

## 2017-06-22 MED ORDER — TRAZODONE HCL 50 MG PO TABS
50.0000 mg | ORAL_TABLET | Freq: Every day | ORAL | Status: DC
  Administered 2017-06-22 – 2017-06-24 (×3): 50 mg via ORAL
  Filled 2017-06-22 (×3): qty 1

## 2017-06-22 MED ORDER — FUROSEMIDE 10 MG/ML IJ SOLN
40.0000 mg | Freq: Once | INTRAMUSCULAR | Status: AC
  Administered 2017-06-22: 40 mg via INTRAVENOUS
  Filled 2017-06-22: qty 4

## 2017-06-22 MED ORDER — FUROSEMIDE 10 MG/ML IJ SOLN
40.0000 mg | Freq: Two times a day (BID) | INTRAMUSCULAR | Status: DC
  Administered 2017-06-23 – 2017-06-25 (×5): 40 mg via INTRAVENOUS
  Filled 2017-06-22 (×5): qty 4

## 2017-06-22 MED ORDER — MELATONIN 3 MG PO TABS
3.0000 mg | ORAL_TABLET | Freq: Every evening | ORAL | Status: DC | PRN
  Administered 2017-06-22: 3 mg via ORAL
  Filled 2017-06-22: qty 1

## 2017-06-22 MED ORDER — HYDRALAZINE HCL 20 MG/ML IJ SOLN: 10.0000 mg | INTRAMUSCULAR | Status: DC | PRN

## 2017-06-22 NOTE — ED Notes (Signed)
Purewick applied.

## 2017-06-22 NOTE — H&P (Signed)
History and Physical    [name]:[id number] DOB: [birth year] DOA: 06/22/2017  PCP: Kirby Funk, MD   Patient coming from: Home  Chief Complaint: Bilateral leg swelling, SOB   HPI: Laura Diaz is a 84 y.o. female with medical history significant for history of stroke, diastolic CHF, and vascular dementia, now presenting to the emergency department for evaluation of shortness of breath and bilateral lower extremity edema.  Patient is unable to provide much history due to dementia, but her daughter at the bedside assists.  She had reportedly been in her usual state until approximately 2 weeks ago when family noted the development of swelling of the bilateral legs that has been progressive.  She is also been noted to have increased work of breathing, also progressing over that interval.  She was evaluated in an outpatient clinic today, noted to be in atrial fibrillation which is reportedly new, and directed to the ED for further evaluation.  Patient specifically denies chest pain.   ED Course: Upon arrival to the ED, patient is found to be afebrile, saturating adequately on 2 L/min supplemental oxygen, slightly tachypneic, bradycardic with rate ~50, and hypertensive to 180/90.  EKG features atrial fibrillation with nonspecific IVCD.  Chest x-ray is notable for cardiomegaly with mild diffuse pulmonary interstitial edema.  Chemistry panel is notable for creatinine 1.23, up from 0.87 last November.  CBC is unremarkable, troponin is normal, and BNP is elevated to 1577.  Patient was given 40 mg IV Lasix in the ED and will be admitted to the telemetry unit for ongoing evaluation and management.  Review of Systems:  All other systems reviewed and apart from HPI, are negative.  Past Medical History:  Diagnosis Date  . Macular degeneration   . Renal disorder    stage 3 per daughter  . Stroke [facility])     Past Surgical History:  Procedure Laterality Date  . HERNIA REPAIR  years ago   umbilical  . PARTIAL HYSTERECTOMY  2006   removed gall bladder     reports that she has quit smoking. She has never used smokeless tobacco. She reports that she does not drink alcohol or use drugs.  Allergies  Allergen Reactions  . Pollen Extract Other (See Comments)    EXCESSIVE WATERING OF EYES    Family History  Problem Relation Age of Onset  . Breast cancer Sister   . Parkinson's disease Sister   . Stroke Father   . Hypertension Son      Prior to Admission medications   Medication Sig Start Date End Date Taking? Authorizing Provider  acetaminophen (TYLENOL) 325 MG tablet Take 650 mg by mouth daily as needed.   Yes [provider]  Aflibercept (EYLEA) 2 MG/0.05ML SOLN 1 drop by Intravitreal route. Into the right eye every 2 months at physician's office   Yes [provider]  apixaban (ELIQUIS) 2.5 MG TABS tablet Take 2.5 mg by mouth 2 (two) times daily.   Yes [provider]  Melatonin 3 MG TABS Take 3 mg by mouth at bedtime.   Yes [provider]  rOPINIRole (REQUIP) 1 MG tablet Take 5 mg by mouth at bedtime.   Yes [provider]  Tetrahydrozoline HCl (VISINE OP) Place 1 drop into the right eye daily as needed (eye discomfort).   Yes [provider]  tobramycin (TOBREX) 0.3 % ophthalmic solution Place 1 drop into the right eye every 4 (four) hours. Use 1 day before and 2 days after  procedure   Yes [provider]  traZODone (DESYREL) 50 MG tablet Take 50 mg by mouth at bedtime.   Yes [provider]    Physical Exam: Vitals:   06/22/17 1645 06/22/17 1655 06/22/17 1701  BP: (!) 176/91  (!) 183/93  Pulse: (!) 47  (!) 51  Resp: 19  (!) 25  Temp:   98.5 F (36.9 C)  TempSrc:   Oral  SpO2: 94%  93%  Weight:  86.6 kg (191 lb)   Height:   (1.626 m)       Constitutional: NAD, calm  Eyes: PERTLA, lids and conjunctivae normal ENMT: Mucous membranes are moist. Posterior pharynx clear of any  exudate or lesions.   Neck: normal, supple, no masses, no thyromegaly Respiratory: Mild tachypnea and dyspnea with speech. Rales bilaterally. No accessory muscle use.  Cardiovascular: Rate ~60 and irregular. 2+ pretibial pitting edema bilaterally. Abdomen: No distension, no tenderness, soft. Bowel sounds normal.  Musculoskeletal: no clubbing / cyanosis. No joint deformity upper and lower extremities.    Skin: no significant rashes, lesions, ulcers. Warm, dry, well-perfused. Neurologic: CN 2-12 grossly intact. Sensation intact. Moving all extremities.  Psychiatric: Alert and oriented to person and place. Calm, cooperative.     Labs on Admission: I have personally reviewed following labs and imaging studies  CBC: Recent Labs  Lab 06/22/17 1645  WBC 7.7  NEUTROABS 6.2  HGB 14.8  HCT 46.1*  MCV 96.2  PLT 264   Basic Metabolic Panel: Recent Labs  Lab 06/22/17 1645  NA 144  K 4.6  CL 111  CO2 22  GLUCOSE 100*  BUN 20  CREATININE 1.23*  CALCIUM 9.1   GFR: Estimated Creatinine Clearance: 30.4 mL/min (A) (by C-G formula based on SCr of 1.23 mg/dL (H)). Liver Function Tests: Recent Labs  Lab 06/22/17 1645  AST 39  ALT 44  ALKPHOS 68  BILITOT 1.4*  PROT 5.9*  ALBUMIN 3.5   No results for input(s): LIPASE, AMYLASE in the last 168 hours. No results for input(s): AMMONIA in the last 168 hours. Coagulation Profile: No results for input(s): INR, PROTIME in the last 168 hours. Cardiac Enzymes: No results for input(s): CKTOTAL, CKMB, CKMBINDEX, TROPONINI in the last 168 hours. BNP (last 3 results) No results for input(s): PROBNP in the last 8760 hours. HbA1C: No results for input(s): HGBA1C in the last 72 hours. CBG: No results for input(s): GLUCAP in the last 168 hours. Lipid Profile: No results for input(s): CHOL, HDL, LDLCALC, TRIG, CHOLHDL, LDLDIRECT in the last 72 hours. Thyroid Function Tests: No results for input(s): TSH, T4TOTAL, FREET4, T3FREE, THYROIDAB in  the last 72 hours. Anemia Panel: No results for input(s): VITAMINB12, FOLATE, FERRITIN, TIBC, IRON, RETICCTPCT in the last 72 hours. Urine analysis:    Component Value Date/Time   COLORURINE AMBER (A) 11/27/2016 1509   APPEARANCEUR CLOUDY (A) 11/27/2016 1509   LABSPEC 1.023 11/27/2016 1509   PHURINE 5.0 11/27/2016 1509   GLUCOSEU NEGATIVE 11/27/2016 1509   HGBUR LARGE (A) 11/27/2016 1509   BILIRUBINUR NEGATIVE 11/27/2016 1509   KETONESUR 5 (A) 11/27/2016 1509   PROTEINUR 100 (A) 11/27/2016 1509   UROBILINOGEN 0.2 03/16/2014 1843   NITRITE NEGATIVE 11/27/2016 1509   LEUKOCYTESUR SMALL (A) 11/27/2016 1509   Sepsis Labs: (procalcitonin:4,lacticidven:4) )No results found for this or any previous visit (from the past 240 hour(s)).   Radiological Exams on Admission: Dg Chest 2 View  Result Date: 06/22/2017 CLINICAL DATA:  Initial evaluation for acute shortness  of breath. EXAM: CHEST - 2 VIEW COMPARISON:  Prior radiograph from 03/17/2014. FINDINGS: Moderate cardiomegaly, stable. Mediastinal silhouette normal. Aortic atherosclerosis. Lungs hypoinflated. Moderate right with small left pleural effusion. Perihilar vascular congestion with diffuse interstitial prominence, suggesting mild pulmonary interstitial edema. Patchy bibasilar opacities favored to reflect atelectatic changes. No other focal infiltrates. No pneumothorax. No acute osseous abnormality. IMPRESSION: 1. Cardiomegaly with mild diffuse pulmonary interstitial edema with right greater than left pleural effusions, suggesting CHF. 2. Associated bibasilar opacities, likely atelectasis. Electronically Signed   By: Rise Mu M.D.   On: 06/22/2017 18:25    EKG: Independently reviewed. Atrial fibrillation, PVC, non-specific IVCD.   Assessment/Plan   1. Acute on chronic diastolic CHF  - Presents with 2 wks of progressive swelling in bilateral legs and progressive dyspnea  - Found to have peripheral edema and rales on  exam, pulm edema on CXR, BNP elevated to 1577  - Echo from 2016 with preserved EF and grade 1 diastolic dysfunction  - Denies chest pain; possibly mediated by arrhythmia  - Treated in ED with Lasix 40 mg IV  - Continue diuresis with Lasix 40 mg IV q12h, SLIV, follow daily wt and I/O's, update echo    2. Atrial fibrillation with slow rate  - In atrial fibrillation on presentation with rates in 40's-50's  - No chest pain or troponin elevation to suggest acute ischemic etiology  - Check magnesium level and TSH; left atrium normal size on echo from 2016   - CHADS-VASc at least 5 (age x2, hx CVA x2, gender)  - Already on Eliquis 2.5 mg BID, will increase to 5 mg BID to reduce stroke risk given wt >60 kg and SCr <1.5  - Continue cardiac monitoring    3. History of CVA; vascular dementia  - Stable, no focal deficits appreciate   4. Renal insufficiency, mild  - SCr is 1.23 on admission, up from 0.87 last November  - Likely secondary to arrhythmia with impaired perfusion  - Follow daily chem panel during diuresis     DVT prophylaxis: Eliquis  Code Status: DNR  Family Communication: Daughter updated at bedside Consults called: None Admission status: Inpatient    Briscoe Deutscher, MD [facility] Pager [phone]  If 7PM-7AM, please contact night-coverage [email or web address] Password [facility]  06/22/2017, 7:38 PM

## 2017-06-22 NOTE — ED Notes (Signed)
Emptied purewick with :

## 2017-06-22 NOTE — ED Notes (Signed)
Pt alert and in room with family @ bedside VS documented Call light within reach 

## 2017-06-22 NOTE — ED Notes (Signed)
Pt alert and resting 

## 2017-06-22 NOTE — ED Notes (Addendum)
Pt stating that her purewick is burning, this EMT changed the purewick and cleaned pt with wet washcloth and hooked pt back up, pt stated it still was burning. This EMT collected a urine sample and told culture and will send down urine. MD and Nurse informed.

## 2017-06-22 NOTE — ED Notes (Signed)
Pt back from x-ray.

## 2017-06-22 NOTE — ED Notes (Signed)
Patient transported to X-ray 

## 2017-06-22 NOTE — ED Notes (Signed)
ED TO INPATIENT HANDOFF REPORT  Name/Age/Gender Laura Diaz 84 y.o. female  Code Status    Code Status Orders  (From admission, onward)        Start     Ordered   06/22/17 1936  Do not attempt resuscitation (DNR)  Continuous    Question Answer Comment  In the event of cardiac or respiratory ARREST Do not call a "code blue"   In the event of cardiac or respiratory ARREST Do not perform Intubation, CPR, defibrillation or ACLS   In the event of cardiac or respiratory ARREST Use medication by any route, position, wound care, and other measures to relive pain and suffering. May use oxygen, suction and manual treatment of airway obstruction as needed for comfort.      06/22/17 1938    Code Status History    Date Active Date Inactive Code Status Order ID Comments User Context   11/27/2016 1714 11/30/2016 1738 DNR [id number]  Florencia Reasons, MD Inpatient   03/17/2014 1402 03/19/2014 1614 DNR [id number]  Donne Hazel, MD Inpatient   03/17/2014 0040 03/17/2014 1402 DNR [id number]  Toy Baker, MD Inpatient      Home/SNF/Other Home  Chief Complaint [facility], [facility]  Level of Care/Admitting Diagnosis ED Disposition    ED Disposition Condition Comment   [place]: [place] [[id number]]  Level of Care: Telemetry [5]  Diagnosis: Acute CHF (congestive heart failure) [facility]) [[id number]]  Admitting Physician: Vianne Bulls [[id number]]  Attending Physician: Vianne Bulls [[id number]]  Estimated length of stay: past midnight tomorrow  Certification:: I certify this patient will need inpatient services for at least 2 midnights  PT Class (Do Not Modify): Inpatient [101]  PT Acc Code (Do Not Modify): Private [1]       Medical History Past Medical History:  Diagnosis Date  . Macular degeneration   . Renal disorder    stage 3 per daughter  . Stroke [facility])     Allergies Allergies  Allergen Reactions  . Pollen Extract Other (See Comments)    EXCESSIVE WATERING  OF EYES    IV Location/Drains/Wounds Patient Lines/Drains/Airways Status   Active Line/Drains/Airways    Name:   Placement date:   Placement time:   Site:   Days:   Peripheral IV 06/22/17 Left Antecubital   06/22/17    1702    Antecubital   less than 1   External Urinary Catheter   11/28/16    2230    -   206          Labs/Imaging Results for orders placed or performed during the hospital encounter of 06/22/17 (from the past 48 hour(s))  CBC with Differential/Platelet     Status: Abnormal   Collection Time: 06/22/17  4:45 PM  Result Value Ref Range   WBC 7.7 4.0 - 10.5 K/uL   RBC 4.79 3.87 - 5.11 MIL/uL   Hemoglobin 14.8 12.0 - 15.0 g/dL   HCT 46.1 (H) 36.0 - 46.0 %   MCV 96.2 78.0 - 100.0 fL   MCH 30.9 26.0 - 34.0 pg   MCHC 32.1 30.0 - 36.0 g/dL   RDW 13.6 11.5 - 15.5 %   Platelets 264 150 - 400 K/uL   Neutrophils Relative % 81 %   Neutro Abs 6.2 1.7 - 7.7 K/uL   Lymphocytes Relative 10 %   Lymphs Abs 0.8 0.7 - 4.0 K/uL   Monocytes Relative 9 %   Monocytes Absolute 0.7 0.1 -  1.0 K/uL   Eosinophils Relative 0 %   Eosinophils Absolute 0.0 0.0 - 0.7 K/uL   Basophils Relative 0 %   Basophils Absolute 0.0 0.0 - 0.1 K/uL   Immature Granulocytes 0 %   Abs Immature Granulocytes 0.0 0.0 - 0.1 K/uL    Comment: Performed at [facility], [place] [address], [place], [place] [address]  Comprehensive metabolic panel     Status: Abnormal   Collection Time: 06/22/17  4:45 PM  Result Value Ref Range   Sodium 144 135 - 145 mmol/L   Potassium 4.6 3.5 - 5.1 mmol/L   Chloride 111 101 - 111 mmol/L   CO2 22 22 - 32 mmol/L   Glucose, Bld 100 (H) 65 - 99 mg/dL   BUN 20 6 - 20 mg/dL   Creatinine, Ser 1.23 (H) 0.44 - 1.00 mg/dL   Calcium 9.1 8.9 - 10.3 mg/dL   Total Protein 5.9 (L) 6.5 - 8.1 g/dL   Albumin 3.5 3.5 - 5.0 g/dL   AST 39 15 - 41 U/L   ALT 44 14 - 54 U/L   Alkaline Phosphatase 68 38 - 126 U/L   Total Bilirubin 1.4 (H) 0.3 - 1.2 mg/dL   GFR calc non Af Amer 37 (L) >60  mL/min   GFR calc Af Amer 42 (L) >60 mL/min    Comment: (NOTE) The eGFR has been calculated using the CKD EPI equation. This calculation has not been validated in all clinical situations. eGFR's persistently <60 mL/min signify possible Chronic Kidney Disease.    Anion gap 11 5 - 15    Comment: Performed at [facility] [address], [place], [place] [address]  Brain natriuretic peptide     Status: Abnormal   Collection Time: 06/22/17  4:46 PM  Result Value Ref Range   B Natriuretic Peptide 1,577.0 (H) 0.0 - 100.0 pg/mL    Comment: Performed at [facility] [address], [place], [place] [address]  I-stat troponin, ED     Status: None   Collection Time: 06/22/17  5:01 PM  Result Value Ref Range   Troponin i, poc 0.06 0.00 - 0.08 ng/mL   Comment 3            Comment: Due to the release kinetics of cTnI, a negative result within the first hours of the onset of symptoms does not rule out myocardial infarction with certainty. If myocardial infarction is still suspected, repeat the test at appropriate intervals.   Magnesium     Status: None   Collection Time: 06/22/17  7:38 PM  Result Value Ref Range   Magnesium 2.2 1.7 - 2.4 mg/dL    Comment: Performed at [facility], [place] [address] [place], [place] [address]  TSH     Status: Abnormal   Collection Time: 06/22/17  7:38 PM  Result Value Ref Range   TSH 6.628 (H) 0.350 - 4.500 uIU/mL    Comment: Performed by a 3rd Generation assay with a functional sensitivity of <=0.01 uIU/mL. Performed at [facility], [place] [address], [place], [place] [address]   Urinalysis, Routine w reflex microscopic     Status: Abnormal   Collection Time: 06/22/17  7:53 PM  Result Value Ref Range   Color, Urine STRAW (A) YELLOW   APPearance CLEAR CLEAR   Specific Gravity, Urine 1.009 1.005 - 1.030   pH 6.0 5.0 - 8.0   Glucose, UA NEGATIVE NEGATIVE mg/dL  Hgb urine dipstick SMALL (A) NEGATIVE   Bilirubin Urine NEGATIVE  NEGATIVE   Ketones, ur NEGATIVE NEGATIVE mg/dL   Protein, ur NEGATIVE NEGATIVE mg/dL   Nitrite NEGATIVE NEGATIVE   Leukocytes, UA NEGATIVE NEGATIVE   WBC, UA 0-5 0 - 5 WBC/hpf   Bacteria, UA NONE SEEN NONE SEEN    Comment: Performed at [facility] [address], [place], [place] [address]   Dg Chest 2 View  Result Date: 06/22/2017 CLINICAL DATA:  Initial evaluation for acute shortness of breath. EXAM: CHEST - 2 VIEW COMPARISON:  Prior radiograph from 03/17/2014. FINDINGS: Moderate cardiomegaly, stable. Mediastinal silhouette normal. Aortic atherosclerosis. Lungs hypoinflated. Moderate right with small left pleural effusion. Perihilar vascular congestion with diffuse interstitial prominence, suggesting mild pulmonary interstitial edema. Patchy bibasilar opacities favored to reflect atelectatic changes. No other focal infiltrates. No pneumothorax. No acute osseous abnormality. IMPRESSION: 1. Cardiomegaly with mild diffuse pulmonary interstitial edema with right greater than left pleural effusions, suggesting CHF. 2. Associated bibasilar opacities, likely atelectasis. Electronically Signed   By: Jeannine Boga M.D.   On: 06/22/2017 18:25    Pending Labs Unresulted Labs (From admission, onward)   Start     Ordered   06/23/17 6045  Basic metabolic panel  Daily,   R     06/22/17 1938      Vitals/Pain Today's Vitals   06/22/17 1645 06/22/17 1655 06/22/17 1700 06/22/17 1701  BP: (!) 176/91   (!) 183/93  Pulse: (!) 47   (!) 51  Resp: 19   (!) 25  Temp:    98.5 F (36.9 C)  TempSrc:    Oral  SpO2: 94%   93%  Weight:  191 lb (86.6 kg)    Height:  5' 4"  (1.626 m)    PainSc:   0-No pain     Isolation Precautions No active isolations  Medications Medications  apixaban (ELIQUIS) tablet 5 mg (has no administration in time range)  rOPINIRole (REQUIP) tablet 5 mg (has no administration in time range)  hydroxypropyl methylcellulose / hypromellose (ISOPTO TEARS / GONIOVISC) 2.5  % ophthalmic solution 1 drop (has no administration in time range)  traZODone (DESYREL) tablet 50 mg (has no administration in time range)  sodium chloride flush (NS) 0.9 % injection 3 mL (has no administration in time range)  sodium chloride flush (NS) 0.9 % injection 3 mL (has no administration in time range)  0.9 %  sodium chloride infusion (has no administration in time range)  acetaminophen (TYLENOL) tablet 650 mg (has no administration in time range)  ondansetron (ZOFRAN) injection 4 mg (has no administration in time range)  furosemide (LASIX) injection 40 mg (has no administration in time range)  ALPRAZolam (XANAX) tablet 0.25 mg (has no administration in time range)  hydrALAZINE (APRESOLINE) injection 10 mg (has no administration in time range)  furosemide (LASIX) injection 40 mg (40 mg Intravenous Given 06/22/17 1911)    Mobility

## 2017-06-22 NOTE — ED Notes (Signed)
EKG complete per order

## 2017-06-22 NOTE — ED Notes (Signed)
Pt dinner tray at bedside, pt is sitting up eating family assisting pt with dinner.

## 2017-06-22 NOTE — ED Notes (Signed)
HH tray ordered 

## 2017-06-22 NOTE — ED Notes (Signed)
Patient noted to have hr rate of 32-38 bpm. Pt asleep in room easily arousable. Provider paged at this time. Awaiting call back

## 2017-06-22 NOTE — ED Triage Notes (Signed)
Pt arrived via [facility]; per EMS pt frm [facility]. Approx 2wks pt experiencing "resp distress" and gets SOB upon exersion and rest; pt also noted to have increased swelling in BLE for last two wks. Pt found to have new onset of AFIb while at MD office with no hx; hx of VA/TIA and on Eliquis; 169/102;24;BG95; 97% on 2L; 50-70 hr; 20 LAC

## 2017-06-22 NOTE — ED Provider Notes (Addendum)
[facility] EMERGENCY DEPARTMENT Provider Note   CSN: [id number] Arrival date & time: 06/22/17  1635     History   Chief Complaint Chief Complaint  Patient presents with  . Shortness of Breath    HPI Laura Diaz is a 84 y.o. female.  The history is provided by the patient and a relative.  Shortness of Breath  This is a new problem. The average episode lasts 2 weeks. The problem occurs continuously.Episode onset: 2 weeks. The problem has been gradually worsening. Associated symptoms include cough and leg swelling. Pertinent negatives include no fever, no sputum production, no wheezing, no orthopnea, no chest pain, no vomiting, no abdominal pain and no leg pain. Associated symptoms comments: Weight gain. It is unknown what precipitated the problem. She has tried nothing for the symptoms. The treatment provided no relief. She has had no prior hospitalizations. Associated medical issues do not include COPD, pneumonia, PE, CAD or heart failure.    Past Medical History:  Diagnosis Date  . Macular degeneration   . Renal disorder    stage 3 per daughter  . Stroke [facility])     Patient Active Problem List   Diagnosis Date Noted  . Supratherapeutic INR 11/28/2016  . History of CVA (cerebrovascular accident) 11/28/2016  . Vascular dementia 11/28/2016  . Rhabdomyolysis 11/28/2016  . Acute lower UTI 11/28/2016  . Fall 11/27/2016  . Encephalopathy 05/18/2014  . Chronic anticoagulation 05/18/2014  . Macular degeneration 05/18/2014  . Confusion with non-focal neuro exam   . Hyperlipidemia   . Stroke (HCC) 03/17/2014  . Delirium 03/16/2014  . Dementia 03/16/2014  . Encephalopathy acute 03/16/2014    Past Surgical History:  Procedure Laterality Date  . HERNIA REPAIR  years ago   umbilical  . PARTIAL HYSTERECTOMY  2006   removed gall bladder     OB History   None      Home Medications    Prior to Admission medications   Medication Sig Start Date End  Date Taking? Authorizing Provider  acetaminophen (TYLENOL) 325 MG tablet Take 650 mg by mouth daily as needed.   Yes [provider]  Aflibercept (EYLEA) 2 MG/0.05ML SOLN 1 drop by Intravitreal route. Into the right eye every 2 months at physician's office   Yes [provider]  apixaban (ELIQUIS) 2.5 MG TABS tablet Take 2.5 mg by mouth 2 (two) times daily.   Yes [provider]  Melatonin 3 MG TABS Take 3 mg by mouth at bedtime.   Yes [provider]  rOPINIRole (REQUIP) 1 MG tablet Take 5 mg by mouth at bedtime.   Yes [provider]  Tetrahydrozoline HCl (VISINE OP) Place 1 drop into the right eye daily as needed (eye discomfort).   Yes [provider]  tobramycin (TOBREX) 0.3 % ophthalmic solution Place 1 drop into the right eye every 4 (four) hours. Use 1 day before and 2 days after procedure   Yes [provider]  traZODone (DESYREL) 50 MG tablet Take 50 mg by mouth at bedtime.   Yes [provider]  cephALEXin (KEFLEX) 500 MG capsule Take 1 capsule (500 mg total) 2 (two) times daily by mouth. Patient not taking: Reported on 06/22/2017 11/30/16   Tyrone Nine, MD  traMADol (ULTRAM) 50 MG tablet Take 1 tablet (50 mg total) 2 (two) times daily by mouth. Patient not taking: Reported on 06/22/2017 11/30/16   Tyrone Nine, MD    Family History Family History  Problem Relation Age of Onset  . Breast cancer Sister   . Parkinson's disease Sister   . Stroke Father   . Hypertension Son     Social History Social History   Tobacco Use  . Smoking status: Former Games developer  . Smokeless tobacco: Never Used  . Tobacco comment: many years ago  Substance Use Topics  . Alcohol use: No  . Drug use: No     Allergies   Pollen extract   Review of Systems Review of Systems  Constitutional: Positive for unexpected weight change. Negative for fever.  Respiratory: Positive for cough and shortness of breath. Negative for sputum  production and wheezing.   Cardiovascular: Positive for leg swelling. Negative for chest pain and orthopnea.  Gastrointestinal: Negative for abdominal pain and vomiting.  All other systems reviewed and are negative.    Physical Exam Updated Vital Signs BP (!) 183/93   Pulse (!) 51   Temp 98.5 F (36.9 C) (Oral)   Resp (!) 25   Ht  (1.626 m)   Wt 86.6 kg (191 lb)   SpO2 93%   BMI 32.79 kg/m   Physical Exam  Constitutional: She is oriented to person, place, and time. She appears well-developed and well-nourished. No distress.  HENT:  Head: Normocephalic and atraumatic.  Mouth/Throat: Oropharynx is clear and moist.  Eyes: Pupils are equal, round, and reactive to light. Conjunctivae and EOM are normal.  Neck: Normal range of motion. Neck supple.  Cardiovascular: Normal rate and intact distal pulses. An irregularly irregular rhythm present.  No murmur heard. Pulmonary/Chest: Effort normal. Tachypnea noted. No respiratory distress. She has no wheezes. She has rales in the right lower field.  Abdominal: Soft. She exhibits no distension. There is no tenderness. There is no rebound and no guarding.  Musculoskeletal: Normal range of motion. She exhibits no tenderness.       Right lower leg: She exhibits edema.       Left lower leg: She exhibits edema.  2+ edema  Neurological: She is alert and oriented to person, place, and time.  Skin: Skin is warm and dry. No rash noted. No erythema.  Psychiatric: She has a normal mood and affect. Her behavior is normal.  Nursing note and vitals reviewed.    ED Treatments / Results  Labs (all labs ordered are listed, but only abnormal results are displayed) Labs Reviewed  CBC WITH DIFFERENTIAL/PLATELET - Abnormal; Notable for the following components:      Result Value   HCT 46.1 (*)    All other components within normal limits  COMPREHENSIVE METABOLIC PANEL - Abnormal; Notable for the following components:   Glucose, Bld 100 (*)     Creatinine, Ser 1.23 (*)    Total Protein 5.9 (*)    Total Bilirubin 1.4 (*)    GFR calc non Af Amer 37 (*)    GFR calc Af Amer 42 (*)    All other components within normal limits  BRAIN NATRIURETIC PEPTIDE - Abnormal; Notable for the following components:   B Natriuretic Peptide 1,577.0 (*)    All other components within normal limits  I-STAT TROPONIN, ED    EKG EKG Interpretation  Date/Time:  Tuesday Jun 22 2017 16:47:18 EDT Ventricular Rate:  66 PR Interval:    QRS Duration: 162 QT Interval:  467 QTC Calculation: 439 R Axis:   114 Text Interpretation:  new Atrial fibrillation Ventricular premature complex new IVCD, consider atypical RBBB Probable anterior infarct, age indeterminate Baseline wander  in lead(s) V2 Confirmed by Gwyneth Sprout ([id number]) on 06/22/2017 5:16:22 PM   Radiology Dg Chest 2 View  Result Date: 06/22/2017 CLINICAL DATA:  Initial evaluation for acute shortness of breath. EXAM: CHEST - 2 VIEW COMPARISON:  Prior radiograph from 03/17/2014. FINDINGS: Moderate cardiomegaly, stable. Mediastinal silhouette normal. Aortic atherosclerosis. Lungs hypoinflated. Moderate right with small left pleural effusion. Perihilar vascular congestion with diffuse interstitial prominence, suggesting mild pulmonary interstitial edema. Patchy bibasilar opacities favored to reflect atelectatic changes. No other focal infiltrates. No pneumothorax. No acute osseous abnormality. IMPRESSION: 1. Cardiomegaly with mild diffuse pulmonary interstitial edema with right greater than left pleural effusions, suggesting CHF. 2. Associated bibasilar opacities, likely atelectasis. Electronically Signed   By: Rise Mu M.D.   On: 06/22/2017 18:25    Procedures Procedures (including critical care time)  Medications Ordered in ED Medications - No data to display   Initial Impression / Assessment and Plan / ED Course  I have reviewed the triage vital signs and the nursing  notes.  Pertinent labs & imaging results that were available during my care of the patient were reviewed by me and considered in my medical decision making (see chart for details).    Patient is an elderly female presenting today with new onset atrial fibrillation with bradycardic rates in the 40s and 50s as well as signs of fluid overload with concern for CHF.  Patient has no prior history of the same.  She was seen by PCP today with 6 pound weight gain and exertional dyspnea.  Patient denies any chest pain at this time.  She is fluid overloaded on exam.  EKG with new atrial fibrillation but patient is already on Eliquis for prior TIA. CBC, CMP, BNP, chest x-ray and troponin pending.  6:44 PM 's are consistent with CHF with a BNP of 1500, normal hemoglobin, troponin of 0.06 and CMP with a creatinine of 1.23.  Patient's chest x-ray shows sign of fluid overload.  Patient was given IV Lasix and will admit for CHF exacerbation and the onset of new atrial fib.   CHA2DS2/VAS Stroke Risk Points      N/A >= 2 Points: High Risk  1 - 1.99 Points: Medium Risk  0 Points: Low Risk    A final score could not be computed because of missing components.:   Last Change: N/A     This score determines the patient's risk of having a stroke if the  patient has atrial fibrillation.      This score is not applicable to this patient. Components are not  calculated.    CRITICAL CARE Performed by: Baleria Wyman Total critical care time: 30 minutes Critical care time was exclusive of separately billable procedures and treating other patients. Critical care was necessary to treat or prevent imminent or life-threatening deterioration. Critical care was time spent personally by me on the following activities: development of treatment plan with patient and/or surrogate as well as nursing, discussions with consultants, evaluation of patient's response to treatment, examination of patient, obtaining history from  patient or surrogate, ordering and performing treatments and interventions, ordering and review of laboratory studies, ordering and review of radiographic studies, pulse oximetry and re-evaluation of patient's condition.    Final Clinical Impressions(s) / ED Diagnoses   Final diagnoses:  Acute on chronic congestive heart failure, unspecified heart failure type [facility])  New onset atrial fibrillation [facility])    ED Discharge Orders    None       Gwyneth Sprout, MD 06/22/17  1610    Gwyneth Sprout, MD 07/12/17 2318

## 2017-06-22 NOTE — ED Notes (Signed)
Spoke with Dr. Antionette Char, aware of pt status. Pt HR dropped to 26 at one point. Order for ekg at this time and he will review the chart and place any new orders

## 2017-06-23 ENCOUNTER — Inpatient Hospital Stay (HOSPITAL_COMMUNITY): Payer: Medicare Other

## 2017-06-23 ENCOUNTER — Other Ambulatory Visit (HOSPITAL_COMMUNITY): Payer: PRIVATE HEALTH INSURANCE

## 2017-06-23 ENCOUNTER — Other Ambulatory Visit: Payer: Self-pay

## 2017-06-23 DIAGNOSIS — I361 Nonrheumatic tricuspid (valve) insufficiency: Secondary | ICD-10-CM

## 2017-06-23 LAB — BASIC METABOLIC PANEL
Anion gap: 11 (ref 5–15)
BUN: 20 mg/dL (ref 6–20)
CO2: 28 mmol/L (ref 22–32)
Calcium: 8.9 mg/dL (ref 8.9–10.3)
Chloride: 105 mmol/L (ref 101–111)
Creatinine, Ser: 1.25 mg/dL — ABNORMAL HIGH (ref 0.44–1.00)
GFR calc Af Amer: 42 mL/min — ABNORMAL LOW (ref >60)
GFR calc non Af Amer: 36 mL/min — ABNORMAL LOW (ref >60)
Glucose, Bld: 113 mg/dL — ABNORMAL HIGH (ref 65–99)
Potassium: 4.1 mmol/L (ref 3.5–5.1)
Sodium: 144 mmol/L (ref 135–145)

## 2017-06-23 LAB — ECHOCARDIOGRAM COMPLETE
Height: 64 in
Weight: 3056 oz

## 2017-06-23 LAB — T4, FREE: Free T4: 1.03 ng/dL (ref 0.82–1.77)

## 2017-06-23 NOTE — Progress Notes (Signed)
PROGRESS NOTE    Laura Diaz  ZOX:[id number] DOB: 04-Dec-1922 DOA: 06/22/2017 PCP: Kirby Funk, MD     Brief Narrative:  Laura Diaz is a 84 y.o. female with medical history significant for history of stroke, diastolic CHF, and vascular dementia, now presenting to the emergency department for evaluation of shortness of breath and bilateral lower extremity edema.  Patient is unable to provide much history due to dementia, but her daughter at the bedside assists.  She had reportedly been in her usual state until approximately 2 weeks ago when family noted the development of swelling of the bilateral legs that has been progressive.  She is also been noted to have increased work of breathing.  She was evaluated in an outpatient clinic, noted to be in atrial fibrillation which is reportedly new, and directed to the ED for further evaluation. EKG features atrial fibrillation and chest x-ray is notable for cardiomegaly with mild diffuse pulmonary interstitial edema.  New events last 24 hours / Subjective: No new events. Patient has no physical complaints. Daughter at bedside states patient's lower extremity edema is much better. Patient denies any chest pain or SOB.   Assessment & Plan:   Principal Problem:   Acute on chronic diastolic CHF (congestive heart failure) (HCC) Active Problems:   History of stroke   History of CVA (cerebrovascular accident)   Vascular dementia   Atrial fibrillation with slow ventricular response (HCC)   Mild renal insufficiency   Elevated blood pressure reading  Acute on chronic diastolic CHF  - Presents with 2 wks of progressive swelling in bilateral legs and progressive dyspnea. Found to have peripheral edema and rales on exam, pulm edema on CXR, BNP elevated to 1577  - Echo from 2016 with preserved EF and grade 1 diastolic dysfunction  - Repeat echo pending - Continue lasix  BID  - No beta blocker in setting of bradycardic rate - No ACE in setting  of AKI   Atrial fibrillation with slow rate  - In atrial fibrillation on presentation with rates in 40's-50's  - CHADS-VASc at least 5 (age x2, hx CVA x2, gender)  - Already on Eliquis 2.5 mg BID, will increase to 5 mg BID to reduce stroke risk given wt >60 kg and SCr <1.5   History of CVA; vascular dementia  - Stable, no focal deficits appreciated  AKI  - SCr is 1.23 on admission, up from 0.87 last November  - Monitor while on lasix    DVT prophylaxis: Eliquis Code Status: DNR Family Communication: Daughter at bedside Disposition Plan: Pending echocardiogram, clinical improvement   Consultants:   None  Procedures:   None   Antimicrobials:  Anti-infectives (From admission, onward)   None       Objective: Vitals:   06/23/17 0700 06/23/17 1000 06/23/17 1100 06/23/17 1219  BP: 131/79 130/65 119/74 136/71  Pulse: (!) 57 (!) 50 (!) 53 (!) 48  Resp: 18  20 (!) 21  Temp:      TempSrc:      SpO2: 100% 100% 98% 98%  Weight:      Height:        Intake/Output Summary (Last 24 hours) at 06/23/2017 1400 Last data filed at 06/23/2017 1223 Gross per 24 hour  Intake -  Output 2400 ml  Net -2400 ml   Filed Weights   06/22/17 1655  Weight: 86.6 kg (191 lb)    Examination:  General exam: Appears calm and comfortable, hard of hearing  Respiratory  system: Clear to auscultation. Respiratory effort normal. Cardiovascular system: S1 & S2 heard, Irreg rhythm, rate 50s. No JVD, murmurs, rubs, gallops or clicks. Trace pedal edema. Gastrointestinal system: Abdomen is nondistended, soft and nontender. No organomegaly or masses felt. Normal bowel sounds heard. Central nervous system: Alert. No focal neurological deficits. Extremities: Symmetric 5 x 5 power. Skin: No rashes, lesions or ulcers Psychiatry: +Dementia hx   Data Reviewed: I have personally reviewed following labs and imaging studies  CBC: Recent Labs  Lab 06/22/17 1645  WBC 7.7  NEUTROABS 6.2  HGB 14.8    HCT 46.1*  MCV 96.2  PLT 264   Basic Metabolic Panel: Recent Labs  Lab 06/22/17 1645 06/22/17 1938 06/23/17 0415  NA 144  --  144  K 4.6  --  4.1  CL 111  --  105  CO2 22  --  28  GLUCOSE 100*  --  113*  BUN 20  --  20  CREATININE 1.23*  --  1.25*  CALCIUM 9.1  --  8.9  MG  --  2.2  --    GFR: Estimated Creatinine Clearance: 30 mL/min (A) (by C-G formula based on SCr of 1.25 mg/dL (H)). Liver Function Tests: Recent Labs  Lab 06/22/17 1645  AST 39  ALT 44  ALKPHOS 68  BILITOT 1.4*  PROT 5.9*  ALBUMIN 3.5   No results for input(s): LIPASE, AMYLASE in the last 168 hours. No results for input(s): AMMONIA in the last 168 hours. Coagulation Profile: No results for input(s): INR, PROTIME in the last 168 hours. Cardiac Enzymes: No results for input(s): CKTOTAL, CKMB, CKMBINDEX, TROPONINI in the last 168 hours. BNP (last 3 results) No results for input(s): PROBNP in the last 8760 hours. HbA1C: No results for input(s): HGBA1C in the last 72 hours. CBG: No results for input(s): GLUCAP in the last 168 hours. Lipid Profile: No results for input(s): CHOL, HDL, LDLCALC, TRIG, CHOLHDL, LDLDIRECT in the last 72 hours. Thyroid Function Tests: Recent Labs    06/22/17 1938 06/23/17 0909  TSH 6.628*  --   FREET4  --  1.03   Anemia Panel: No results for input(s): VITAMINB12, FOLATE, FERRITIN, TIBC, IRON, RETICCTPCT in the last 72 hours. Sepsis Labs: No results for input(s): PROCALCITON, LATICACIDVEN in the last 168 hours.  No results found for this or any previous visit (from the past 240 hour(s)).     Radiology Studies: Dg Chest 2 View  Result Date: 06/22/2017 CLINICAL DATA:  Initial evaluation for acute shortness of breath. EXAM: CHEST - 2 VIEW COMPARISON:  Prior radiograph from 03/17/2014. FINDINGS: Moderate cardiomegaly, stable. Mediastinal silhouette normal. Aortic atherosclerosis. Lungs hypoinflated. Moderate right with small left pleural effusion. Perihilar  vascular congestion with diffuse interstitial prominence, suggesting mild pulmonary interstitial edema. Patchy bibasilar opacities favored to reflect atelectatic changes. No other focal infiltrates. No pneumothorax. No acute osseous abnormality. IMPRESSION: 1. Cardiomegaly with mild diffuse pulmonary interstitial edema with right greater than left pleural effusions, suggesting CHF. 2. Associated bibasilar opacities, likely atelectasis. Electronically Signed   By: Rise Mu M.D.   On: 06/22/2017 18:25      Scheduled Meds: . apixaban  5 mg Oral BID  . furosemide  40 mg Intravenous Q12H  . rOPINIRole  5 mg Oral QHS  . sodium chloride flush  3 mL Intravenous Q12H  . traZODone  50 mg Oral QHS   Continuous Infusions: . sodium chloride       LOS: 1 day    Time spent:  35 minutes   Noralee Stain, DO [facility] [email or web address] Password [email or web address] 06/23/2017, 2:00 PM

## 2017-06-23 NOTE — ED Notes (Signed)
Dr. Choi at bedside.

## 2017-06-23 NOTE — Evaluation (Signed)
Physical Therapy Evaluation Patient Details Name: Laura Diaz MRN: [id number] DOB: 1922/05/08 Today's Date: 06/23/2017   History of Present Illness  Pt is a 84 y/o female admitted secondary to increased SOB. Chest imaging revealed cardiomegaly suggesting CHF. PMH includes CVA, dCHF, and vascular dementia.   Clinical Impression  Pt admitted secondary to problem above with deficits below. Pt presenting with cognitive deficits (baseline), weakness, and mild balance deficits with use of RW. Required min to min guard A for functional mobility tasks. Pt with increased SOB with ambulation within the room, however, oxygen sats WFL on 2L of oxygen. Pt's son reports pt lives with him and plan is to return home at d/c. Pt's son reports pt has HHaide that comes for 3 hours, 3 days/week. Will continue to follow acutely to maximize functional mobility independence and safety.     Follow Up Recommendations Home health PT;Supervision/Assistance - 24 hour    Equipment Recommendations  None recommended by PT(pt's son refusing 3 in 1)    Recommendations for Other Services OT consult     Precautions / Restrictions Precautions Precautions: Fall Restrictions Weight Bearing Restrictions: No      Mobility  Bed Mobility Overal bed mobility: Needs Assistance Bed Mobility: Supine to Sit;Sit to Supine     Supine to sit: Min assist Sit to supine: Supervision   General bed mobility comments: Min A for trunk elevation to come to sitting. Supervision to return to supine.   Transfers Overall transfer level: Needs assistance Equipment used: Rolling walker (2 wheeled) Transfers: Sit to/from [employer] Sit to Stand: Min assist Stand pivot transfers: Min guard       General transfer comment: Min A for lift assist and steadying to stand. Verbal cues for safe hand placement. Min guard for safety to transfer to [facility]. Pt forgetting that she asked to go to bathroom, and had to be reminded  about task at hand.   Ambulation/Gait Ambulation/Gait assistance: Min guard Ambulation Distance (Feet): 20 Feet Assistive device: Rolling walker (2 wheeled) Gait Pattern/deviations: Step-through pattern;Decreased stride length;Trunk flexed Gait velocity: Decreased  Gait velocity interpretation: <1.31 ft/sec, indicative of household ambulator General Gait Details: Slow, mildly unsteady gait, however, no LOB noted. Required consistent cues to stay within RW. Pt with SOB, however, oxygen sats WFL on 2L of oxygen. Distance limited to within the room as no portable oxygen tank available.   Stairs            Wheelchair Mobility    Modified Rankin (Stroke Patients Only)       Balance Overall balance assessment: Needs assistance Sitting-balance support: No upper extremity supported;Feet supported Sitting balance-Leahy Scale: Good     Standing balance support: Bilateral upper extremity supported;During functional activity Standing balance-Leahy Scale: Poor Standing balance comment: Reliant on BUE support.                              Pertinent Vitals/Pain Pain Assessment: No/denies pain    Home Living Family/patient expects to be discharged to:: Private residence Living Arrangements: Children Available Help at Discharge: Family;Personal care attendant;Available PRN/intermittently Type of Home: House Home Access: Stairs to enter Entrance Stairs-Rails: Right;Left;Can reach both Entrance Stairs-Number of Steps: 3 Home Layout: One level Home Equipment: Walker - 2 wheels;Shower seat Additional Comments: PCA comes for 3 hours/day, 3 days/week     Prior Function Level of Independence: Needs assistance   Gait / Transfers Assistance Needed: Uses RW for ambulation  with supervision.   ADL's / Homemaking Assistance Needed: Requires assist from [facility] for bathing         Hand Dominance   Dominant Hand: Right    Extremity/Trunk Assessment   Upper Extremity  Assessment Upper Extremity Assessment: Defer to OT evaluation    Lower Extremity Assessment Lower Extremity Assessment: Generalized weakness    Cervical / Trunk Assessment Cervical / Trunk Assessment: Kyphotic  Communication   Communication: HOH  Cognition Arousal/Alertness: Awake/alert Behavior During Therapy: WFL for tasks assessed/performed Overall Cognitive Status: History of cognitive impairments - at baseline                                 General Comments: Dementia at baseline       General Comments General comments (skin integrity, edema, etc.): Pt's son reports plan is to take pt home with him at d/c.     Exercises     Assessment/Plan    PT Assessment Patient needs continued PT services  PT Problem List Decreased strength;Decreased balance;Decreased activity tolerance;Decreased mobility;Decreased knowledge of use of DME;Decreased cognition;Decreased safety awareness;Decreased knowledge of precautions;Cardiopulmonary status limiting activity       PT Treatment Interventions DME instruction;Gait training;Stair training;Therapeutic activities;Functional mobility training;Balance training;Therapeutic exercise;Patient/family education    PT Goals (Current goals can be found in the Care Plan section)  Acute Rehab PT Goals Patient Stated Goal: for pt to go home per son  PT Goal Formulation: With family Time For Goal Achievement: 07/07/17 Potential to Achieve Goals: Good    Frequency Min 3X/week   Barriers to discharge        Co-evaluation               AM-PAC PT "6 Clicks" Daily Activity  Outcome Measure Difficulty turning over in bed (including adjusting bedclothes, sheets and blankets)?: A Little Difficulty moving from lying on back to sitting on the side of the bed? : Unable Difficulty sitting down on and standing up from a chair with arms (e.g., wheelchair, bedside commode, etc,.)?: Unable Help needed moving to and from a bed to chair  (including a wheelchair)?: A Little Help needed walking in hospital room?: A Little Help needed climbing 3-5 steps with a railing? : A Lot 6 Click Score: 13    End of Session Equipment Utilized During Treatment: Gait belt;Oxygen Activity Tolerance: Patient tolerated treatment well Patient left: in bed;with call bell/phone within reach;with bed alarm set;with family/visitor present Nurse Communication: Mobility status;Other (comment)(pt voided ) PT Visit Diagnosis: Muscle weakness (generalized) (M62.81);Other abnormalities of gait and mobility (R26.89);Unsteadiness on feet (R26.81)    Time: 6578-4696 PT Time Calculation (min) (ACUTE ONLY): 26 min   Charges:   PT Evaluation $PT Eval Low Complexity: 1 Low PT Treatments $Therapeutic Activity: 8-22 mins   PT G Codes:        Gladys Damme, PT, DPT  Acute Rehabilitation Services  Pager: [phone]   Lehman Prom 06/23/2017, 6:59 PM

## 2017-06-23 NOTE — ED Notes (Signed)
Pt resting. Call light within reach.

## 2017-06-23 NOTE — Progress Notes (Signed)
  Echocardiogram 2D Echocardiogram has been performed.  Laura Diaz 06/23/2017, 3:05 PM

## 2017-06-23 NOTE — ED Notes (Signed)
Emptied purewick with Pt repositioned in bed and is resting comfortably Call light within reach

## 2017-06-24 DIAGNOSIS — I4891 Unspecified atrial fibrillation: Secondary | ICD-10-CM

## 2017-06-24 DIAGNOSIS — Z8673 Personal history of transient ischemic attack (TIA), and cerebral infarction without residual deficits: Secondary | ICD-10-CM

## 2017-06-24 DIAGNOSIS — R030 Elevated blood-pressure reading, without diagnosis of hypertension: Secondary | ICD-10-CM

## 2017-06-24 DIAGNOSIS — I5033 Acute on chronic diastolic (congestive) heart failure: Principal | ICD-10-CM

## 2017-06-24 LAB — BASIC METABOLIC PANEL
Anion gap: 10 (ref 5–15)
BUN: 16 mg/dL (ref 6–20)
CO2: 32 mmol/L (ref 22–32)
Calcium: 9 mg/dL (ref 8.9–10.3)
Chloride: 99 mmol/L — ABNORMAL LOW (ref 101–111)
Creatinine, Ser: 1.23 mg/dL — ABNORMAL HIGH (ref 0.44–1.00)
GFR calc Af Amer: 42 mL/min — ABNORMAL LOW (ref >60)
GFR calc non Af Amer: 37 mL/min — ABNORMAL LOW (ref >60)
Glucose, Bld: 97 mg/dL (ref 65–99)
Potassium: 3.6 mmol/L (ref 3.5–5.1)
Sodium: 141 mmol/L (ref 135–145)

## 2017-06-24 NOTE — Progress Notes (Signed)
Patient Demographics:    Laura Diaz, is a 84 y.o. female, DOB - 1922/08/27, ZOX:[id number]  Admit date - 06/22/2017   Admitting Physician Briscoe Deutscher, MD  Outpatient Primary MD for the patient is Kirby Funk, MD  LOS - 2   Chief Complaint  Patient presents with  . Shortness of Breath        Subjective:    Laura Diaz today has no fevers, no emesis,  No chest pain, cognitive deficits, hard of hearing, son and daughter at bedside, no new complaints  Assessment  & Plan :    Principal Problem:   Acute on chronic diastolic CHF (congestive heart failure) (HCC) Active Problems:   History of stroke   History of CVA (cerebrovascular accident)   Vascular dementia   Atrial fibrillation with slow ventricular response (HCC)   Mild renal insufficiency   Elevated blood pressure reading  Brief summary 84 y.o.femalewith medical history significant forhistory of stroke, diastolic CHF, and vascular dementia admitted on 06/22/2017 with congestive heart failure in the setting of "new onset" A. fib with some bradycardia   Plan:-  1)HFpEF--- admitted with acute on chronic diastolic dysfunction CHF, echo showed diastolic dysfunction with PFO, EF 60 to 65%, diuresing well, clinically improving, continue Lasix 40 mg twice daily, No ACEI due to kidney concerns  2)"New Onset" Afib with slow rate--heart rate 30s to 50s, cardiology consult appreciated,, prior history of stroke, currently on Eliquis 5 mg twice daily, however patient has history of falls. ???  If her falls are related to arrhythmia  3)H/o CVA/TIA/vascular dementia-currently on Eliquis, echo shows PFO which may have contributed to her CVA/TIA  4)Aki--Baseline creatinine 0.8, on admission creatinine 1.23, monitor renal function and electrolytes closely with diuresis  5)Generalized weakness/Debility--- PT OT eval appreciated, recommend home health  PT  Code Status : DNR  Disposition Plan  : Home with home health  Consults  : Cardiology  DVT Prophylaxis  :   Eliquis  Lab Results  Component Value Date   PLT 264 06/22/2017    Inpatient Medications  Scheduled Meds: . apixaban  5 mg Oral BID  . furosemide  40 mg Intravenous Q12H  . rOPINIRole  5 mg Oral QHS  . sodium chloride flush  3 mL Intravenous Q12H  . traZODone  50 mg Oral QHS   Continuous Infusions: . sodium chloride     PRN Meds:.sodium chloride, acetaminophen, ALPRAZolam, hydrALAZINE, hydroxypropyl methylcellulose / hypromellose, Melatonin, ondansetron (ZOFRAN) IV, sodium chloride flush    Anti-infectives (From admission, onward)   None        Objective:   Vitals:   06/23/17 2016 06/24/17 0705 06/24/17 1135 06/24/17 1159  BP: (!) 141/62  (!) 149/78   Pulse: (!) 53  (!) 57   Resp:      Temp: 98.1 F (36.7 C)  (!) 97.4 F (36.3 C)   TempSrc: Oral  Oral   SpO2: 95%  100% 94%  Weight:  83.5 kg (184 lb)    Height:        Wt Readings from Last 3 Encounters:  06/24/17 83.5 kg (184 lb)  11/28/16 88.9 kg (195 lb 15.8 oz)  05/18/14 83.5 kg (184 lb)     Intake/Output Summary (Last 24 hours)  at 06/24/2017 1657 Last data filed at 06/24/2017 0120 Gross per 24 hour  Intake 120 ml  Output 450 ml  Net -330 ml     Physical Exam  Gen:- Awake Alert, in no acute distress HEENT:- [place].AT, No sclera icterus Eyes-macular degeneration with visual defects Ears-hard of hearing Neck-Supple Neck,No JVD,.  Lungs-improved air movement, no wheezing CV- S1, S2 normal, 2/6 systolic murmur, irregularly irregular heart rate 44 Abd-  +ve B.Sounds, Abd Soft, No tenderness,    Extremity/Skin:-Improving lower extremity edema, good pulses  psych-affect is appropriate, some cognitive deficits due to underlying dementia  neuro-no new focal deficits, no tremors   Data Review:   Micro Results No results found for this or any previous visit (from the past 240  hour(s)).  Radiology Reports Dg Chest 2 View  Result Date: 06/22/2017 CLINICAL DATA:  Initial evaluation for acute shortness of breath. EXAM: CHEST - 2 VIEW COMPARISON:  Prior radiograph from 03/17/2014. FINDINGS: Moderate cardiomegaly, stable. Mediastinal silhouette normal. Aortic atherosclerosis. Lungs hypoinflated. Moderate right with small left pleural effusion. Perihilar vascular congestion with diffuse interstitial prominence, suggesting mild pulmonary interstitial edema. Patchy bibasilar opacities favored to reflect atelectatic changes. No other focal infiltrates. No pneumothorax. No acute osseous abnormality. IMPRESSION: 1. Cardiomegaly with mild diffuse pulmonary interstitial edema with right greater than left pleural effusions, suggesting CHF. 2. Associated bibasilar opacities, likely atelectasis. Electronically Signed   By: Rise Mu M.D.   On: 06/22/2017 18:25     CBC Recent Labs  Lab 06/22/17 1645  WBC 7.7  HGB 14.8  HCT 46.1*  PLT 264  MCV 96.2  MCH 30.9  MCHC 32.1  RDW 13.6  LYMPHSABS 0.8  MONOABS 0.7  EOSABS 0.0  BASOSABS 0.0    Chemistries  Recent Labs  Lab 06/22/17 1645 06/22/17 1938 06/23/17 0415 06/24/17 1107  NA 144  --  144 141  K 4.6  --  4.1 3.6  CL 111  --  105 99*  CO2 22  --  28 32  GLUCOSE 100*  --  113* 97  BUN 20  --  20 16  CREATININE 1.23*  --  1.25* 1.23*  CALCIUM 9.1  --  8.9 9.0  MG  --  2.2  --   --   AST 39  --   --   --   ALT 44  --   --   --   ALKPHOS 68  --   --   --   BILITOT 1.4*  --   --   --    ------------------------------------------------------------------------------------------------------------------ No results for input(s): CHOL, HDL, LDLCALC, TRIG, CHOLHDL, LDLDIRECT in the last 72 hours.  Lab Results  Component Value Date   HGBA1C 6.0 (H) 03/18/2014   ------------------------------------------------------------------------------------------------------------------ Recent Labs    06/22/17 1938   TSH 6.628*   ------------------------------------------------------------------------------------------------------------------ No results for input(s): VITAMINB12, FOLATE, FERRITIN, TIBC, IRON, RETICCTPCT in the last 72 hours.  Coagulation profile No results for input(s): INR, PROTIME in the last 168 hours.  No results for input(s): DDIMER in the last 72 hours.  Cardiac Enzymes No results for input(s): CKMB, TROPONINI, MYOGLOBIN in the last 168 hours.  Invalid input(s): CK ------------------------------------------------------------------------------------------------------------------    Component Value Date/Time   BNP 1,577.0 (H) 06/22/2017 1646     Rayfield Beem M.D on 06/24/2017 at 4:57 PM  Between 7am to 7pm - Pager - [phone]  After 7pm go to [email or web address] - password [facility]  [facility] -  Office  [phone]   Voice Recognition /  Dragon dictation system was used to create this note, attempts have been made to correct errors. Please contact the author with questions and/or clarifications.

## 2017-06-24 NOTE — Consult Note (Addendum)
Cardiology Consult    Patient ID: Laura Diaz MRN: [id number], DOB/AGE: 06-06-1922   Admit date: 06/22/2017 Date of Consult: 06/24/2017  Primary Physician: Kirby Funk, MD Primary Cardiologist: New Requesting Provider: Dr. Mariea Clonts Reason for Consultation: Afib and CHF  Laura Diaz is a 84 y.o. female who is being seen today for the evaluation of Afib and CHF at the request of Dr. Mariea Clonts.   Patient Profile    84 yo female with PMH of CVA, diastolic CHF, dementia who presented with new onset of Afib and acute on chronic diastolic HF.   Past Medical History   Past Medical History:  Diagnosis Date  . Macular degeneration   . Renal disorder    stage 3 per daughter  . Stroke [facility])     Past Surgical History:  Procedure Laterality Date  . HERNIA REPAIR  years ago   umbilical  . PARTIAL HYSTERECTOMY  2006   removed gall bladder     Allergies  Allergies  Allergen Reactions  . Pollen Extract Other (See Comments)    EXCESSIVE WATERING OF EYES    History of Present Illness    Laura Diaz is a 84 yo female with PMH of CVA, diastolic CHF, dementia. She currently lives with her son mostly and then her daughter when needed. Has not seen a cardiologist in the past. With hx of TIA/CVA had been on coumadin in the past, but switched to Eliquis and reduced to low dose 2.5mg  BID by PCP. Has progressive dementia and needs help with most of her ADLs. Daughter reports she was staying with her over the weekend and she noted significant LE edema and shortness of breath with minimal activity. Seemed to progress over the weekend. Her daughter made an appt on Tuesday to see the PCP. At this visit she was noted to be in new onset Afib and transferred to the ED for further work up.    In the ED her labs showed stable electrolytes,  Cr 1.23, BNP 1577, Hgb 14.8, Trop 0.06, TSH 6.6. CXR with cardiomegaly along with bilateral pleural effusions. EKG showed rate controlled Afib. She was admitted  and placed on IV lasix  BID. Little UOP recorded but patient is also incontinent at times. Weight is down 191>>184. Daughter reports LE edema has improved. Echo showed normal EF with mild to moderate MR, moderate LAE, mildly dilated RV and PFO. She has been working PT and actually progressing well. Has been on RA with stable O2 sat at rest with desats with activity requiring O2.   Inpatient Medications    . apixaban  5 mg Oral BID  . furosemide  40 mg Intravenous Q12H  . rOPINIRole  5 mg Oral QHS  . sodium chloride flush  3 mL Intravenous Q12H  . traZODone  50 mg Oral QHS   Family History    Family History  Problem Relation Age of Onset  . Breast cancer Sister   . Parkinson's disease Sister   . Stroke Father   . Hypertension Son    Social History    Social History   Socioeconomic History  . Marital status: Married    Spouse name: Not on file  . Number of children: Not on file  . Years of education: Not on file  . Highest education level: Not on file  Occupational History  . Not on file  Social Needs  . Financial resource strain: Not on file  . Food insecurity:    Worry: Not  on file    Inability: Not on file  . Transportation needs:    Medical: Not on file    Non-medical: Not on file  Tobacco Use  . Smoking status: Former Games developer  . Smokeless tobacco: Never Used  . Tobacco comment: many years ago  Substance and Sexual Activity  . Alcohol use: No  . Drug use: No  . Sexual activity: Never  Lifestyle  . Physical activity:    Days per week: Not on file    Minutes per session: Not on file  . Stress: Not on file  Relationships  . Social connections:    Talks on phone: Not on file    Gets together: Not on file    Attends religious service: Not on file    Active member of club or organization: Not on file    Attends meetings of clubs or organizations: Not on file    Relationship status: Not on file  . Intimate partner violence:    Fear of current or ex partner:  Not on file    Emotionally abused: Not on file    Physically abused: Not on file    Forced sexual activity: Not on file  Other Topics Concern  . Not on file  Social History Narrative  . Not on file     Review of Systems    See HPI  All other systems reviewed and are otherwise negative except as noted above.  Physical Exam    Blood pressure (!) 149/78, pulse (!) 57, temperature (!) 97.4 F (36.3 C), temperature source Oral, resp. rate 14, height  (1.626 m), weight 184 lb (83.5 kg), SpO2 94 %.  General: Pleasant, older WF, NAD. HOH Psych: Normal affect. Neuro: Alert and oriented to self and surroundings. Moves all extremities spontaneously. HEENT: Normal  Neck: Supple, no JVD. Lungs:  Resp regular and unlabored, diminished bilaterally in lower lobes. Heart: Irreg Irreg no murmurs. Abdomen: Soft, non-tender, non-distended, BS + x 4.  Extremities: No clubbing, cyanosis 1+ LE edema. DP/PT/Radials 2+ and equal bilaterally.  Labs    Troponin [facility] of Care Test) Recent Labs    06/22/17 1701  TROPIPOC 0.06   No results for input(s): CKTOTAL, CKMB, TROPONINI in the last 72 hours. Lab Results  Component Value Date   WBC 7.7 06/22/2017   HGB 14.8 06/22/2017   HCT 46.1 (H) 06/22/2017   MCV 96.2 06/22/2017   PLT 264 06/22/2017    Recent Labs  Lab 06/22/17 1645  06/24/17 1107  NA 144   < > 141  K 4.6   < > 3.6  CL 111   < > 99*  CO2 22   < > 32  BUN 20   < > 16  CREATININE 1.23*   < > 1.23*  CALCIUM 9.1   < > 9.0  PROT 5.9*  --   --   BILITOT 1.4*  --   --   ALKPHOS 68  --   --   ALT 44  --   --   AST 39  --   --   GLUCOSE 100*   < > 97   < > = values in this interval not displayed.   Lab Results  Component Value Date   CHOL 209 (H) 03/18/2014   HDL 36 (L) 03/18/2014   LDLCALC 149 (H) 03/18/2014   TRIG 118 03/18/2014   No results found for: [facility]   Radiology Studies    Dg Chest 2 View  Result Date: 06/22/2017 CLINICAL DATA:  Initial evaluation  for acute shortness of breath. EXAM: CHEST - 2 VIEW COMPARISON:  Prior radiograph from 03/17/2014. FINDINGS: Moderate cardiomegaly, stable. Mediastinal silhouette normal. Aortic atherosclerosis. Lungs hypoinflated. Moderate right with small left pleural effusion. Perihilar vascular congestion with diffuse interstitial prominence, suggesting mild pulmonary interstitial edema. Patchy bibasilar opacities favored to reflect atelectatic changes. No other focal infiltrates. No pneumothorax. No acute osseous abnormality. IMPRESSION: 1. Cardiomegaly with mild diffuse pulmonary interstitial edema with right greater than left pleural effusions, suggesting CHF. 2. Associated bibasilar opacities, likely atelectasis. Electronically Signed   By: Rise Mu M.D.   On: 06/22/2017 18:25    ECG & Cardiac Imaging    EKG:  The EKG was personally reviewed and demonstrates Afib, HR 66 with prolonged QT interval   Echo: 06/23/17  Study Conclusions  - Left ventricle: The cavity size was normal. Wall thickness was   increased in a pattern of mild LVH. Systolic function was normal.   The estimated ejection fraction was in the range of 60% to 65%.   Wall motion was normal; there were no regional wall motion   abnormalities. The study is not technically sufficient to allow   evaluation of LV diastolic function. - Aortic valve: Trileaflet. Sclerosis without stenosis. There was   no regurgitation. - Mitral valve: Mildly thickened leaflets . There was mild to   moderate regurgitation. - Left atrium: Moderately dilated. - Right ventricle: The cavity size was mildly dilated. Mildly   reduced systolic function. - Atrial septum: There was a patent foramen ovale. - Tricuspid valve: There was mild to moderate regurgitation. - Pulmonary arteries: PA peak pressure: 49 mm Hg (S). - Inferior vena cava: The vessel was dilated. The respirophasic   diameter changes were blunted (< 50%), consistent with elevated   central  venous pressure.  Impressions:  - LVEF 60-65%, mild LVH, normal wall motion, aortic valve   sclerosis, mild to moderate MR, moderate LAE, mildly dilated RV   with mildly reduced systolic function, mild to moderate TR, RVSP   49 mmHg, dilated IVC - a patent foramen ovale (PFO) with left to   right shunting by color doppler is noted.   Assessment & Plan    84 yo female with PMH of CVA, diastolic CHF, dementia who presented with new onset of Afib and acute on chronic diastolic HF.   1. New onset Afib: Unclear duration as patient was not symptomatic until over the weekend when she develop HF symptoms. She has been on reduced dose eliquis for her hx of CVA/TIA. Remains in Afib with slow ventricular rate, mostly mis 40s to 50s. No need for AV blocking agent at this time. This patients CHA2DS2-VASc Score of at least 5. Need to consider OAC moving forward given her functional status, and dementia. Daughter reports concern over her stability at home, and she does have a hx of falls.   2. Acute on Chronic diastolic HF: echo this admission showed normal EF. BNP >1500 on admission, along with effusions on CXR. Diuresing with IV lasix, weight trending down. Would continue with current lasix dose and reassess in the am. Will likely need a diuretic going home.   3. PFO: noted on echo. Suspect this could have played a role in her previous TIA/CVA. At this time suspect she may not be a good candidate for intervention given her age and dementia.   4. AKI: 1.23 today. Monitor with diuresis.   Signed, Laverda Page, NP-C Pager [phone]  06/24/2017, 2:42 PM   The patient was seen, examined and discussed with Laverda Page, NP-C and I agree with the above.   84 yo female with PMH of CVA, diastolic CHF, dementia who presented with new onset of Afib and acute on chronic diastolic HF. She is new to our service. Echo shows normal LVEF, moderately dilated LA, mild to moderate mitral regurgitation,  moderate pulmonary hypertension. Telemetry shows rate controlled a-fib, ECG shows new a-fib with non-specific ST T Wave abnormalities, previously in SR on ECG in 11/2016. CHADS-VASc 7, she is on Eliquis 5 mg po BID (Weight > 80 kg, Crea < 1.5). She was started on lasix 40 mg iv BID with good response - 7 lbs down in 2 days. She remains fluid overloaded, currently 184 lbs, but 195 lbs in Nov 2018, 183 in 2016. Continue the same dose of lasix we will reevaluate in the am. Regarding a-fib - we will focus on rate control, she is in fact bradycardic - ventricular rate down to 38, no pauses  > 3 seconds, no dizziness, no recent falls.  Tobias Alexander, MD 06/24/2017

## 2017-06-24 NOTE — Progress Notes (Signed)
Physical Therapy Treatment Patient Details Name: Laura Diaz MRN: [id number] DOB: 03-08-1922 Today's Date: 06/24/2017    History of Present Illness Pt is a 84 y/o female admitted secondary to increased SOB. Chest imaging revealed cardiomegaly suggesting CHF. PMH includes CVA, dCHF, and vascular dementia.     PT Comments    Pt pleasant but difficult to communicate with secondary to pt being HOH. Pt demonstrates significant mobility improvements today as pt was able to ambulate 200 ft min guard with RW and navigate 3 stairs using bilateral hand rails. Pt SpO2% @ 98 at rest on room air. Pt desat to 88% when being taken to stairs sitting in recliner. Pt placed on 2 LO2 for all activity with SpO2% remaining>90%.  Follow Up Recommendations  Home health PT;Supervision/Assistance - 24 hour     Equipment Recommendations  (Pt's son refusing 3 in 1 per last PT note)    Recommendations for Other Services       Precautions / Restrictions Precautions Precautions: Fall Restrictions Weight Bearing Restrictions: No    Mobility  Bed Mobility               General bed mobility comments: Pt found sitting on BSC  Transfers Overall transfer level: Needs assistance   Transfers: Stand Pivot Transfers   Stand pivot transfers: Min guard       General transfer comment: Pt performed stand-pivot transfer from BSC>recliner chair min guard. Pt cued for hand placement when sitting in recliner. Pt performed stnad-pivot on room air due to 98% SpO2  Ambulation/Gait Ambulation/Gait assistance: Min guard Ambulation Distance (Feet): 200 Feet Assistive device: Rolling walker (2 wheeled) Gait Pattern/deviations: Step-through pattern;Decreased stride length;Trunk flexed Gait velocity: Decreased    General Gait Details: Min guard for safety and cues for upright posture and to step forward into RW. Pt began ambulated on 2 LO2 with SpO2% remaining above 90%   Stairs Stairs: Yes Stairs  assistance: Min guard Stair Management: Step to pattern;Two rails Number of Stairs: 3 General stair comments: Pt ascended/descended 3 stairs with bilateral rails min guard for safety with cueing for transitioning from RW to stairs. 2 LO2 used for stairs   Wheelchair Mobility    Modified Rankin (Stroke Patients Only)       Balance Overall balance assessment: Mild deficits observed, not formally tested Sitting-balance support: No upper extremity supported;Feet supported Sitting balance-Leahy Scale: Good     Standing balance support: No upper extremity supported;During functional activity Standing balance-Leahy Scale: Fair Standing balance comment: During functional activity; pt stable in standing without UE support.                            Cognition Arousal/Alertness: Awake/alert Behavior During Therapy: WFL for tasks assessed/performed Overall Cognitive Status: History of cognitive impairments - at baseline                                 General Comments: Pt HOH but responded appropriately to cues and answered questions appropriately when pt heard PT      Exercises      General Comments        Pertinent Vitals/Pain Pain Assessment: No/denies pain    Home Living                      Prior Function  PT Goals (current goals can now be found in the care plan section) Acute Rehab PT Goals Patient Stated Goal: for pt to go home per son  PT Goal Formulation: With patient/family Time For Goal Achievement: 07/07/17 Potential to Achieve Goals: Good Progress towards PT goals: Progressing toward goals    Frequency    Min 3X/week      PT Plan Current plan remains appropriate    Co-evaluation              AM-PAC PT "6 Clicks" Daily Activity  Outcome Measure  Difficulty turning over in bed (including adjusting bedclothes, sheets and blankets)?: A Little Difficulty moving from lying on back to sitting on the  side of the bed? : A Little Difficulty sitting down on and standing up from a chair with arms (e.g., wheelchair, bedside commode, etc,.)?: A Little Help needed moving to and from a bed to chair (including a wheelchair)?: A Little Help needed walking in hospital room?: A Little Help needed climbing 3-5 steps with a railing? : A Little 6 Click Score: 18    End of Session Equipment Utilized During Treatment: Gait belt;Oxygen Activity Tolerance: Patient tolerated treatment well Patient left: in chair;with chair alarm set;with call bell/phone within reach Nurse Communication: Mobility status PT Visit Diagnosis: Muscle weakness (generalized) (M62.81);Other abnormalities of gait and mobility (R26.89);Unsteadiness on feet (R26.81)     Time: 9604-5409 PT Time Calculation (min) (ACUTE ONLY): 28 min  Charges:  $Gait Training: 8-22 mins $Therapeutic Activity: 8-22 mins                    G Codes:       Gabe Ioannis Schuh, SPT   [employer] 06/24/2017, 12:28 PM

## 2017-06-24 NOTE — Discharge Instructions (Signed)

## 2017-06-24 NOTE — Progress Notes (Signed)
I was called by  Central monitoring to check pt heart monitoring leads. When I arrived to the room pt was very confused around 4 am.She was agitated and tried to get out of bed. We tried to redirect her to get back in the bed but she refused and said we had abducted her.We called the son who was able to come sit with her this morning as pt has been refusing any nursing care including medications. She has relaxed a little since the arrival of her son so it appears we might be able to administer care within a few hours. We agreed with the son to let her sleep for about an hour before we attempt to administer medications.

## 2017-06-24 NOTE — Evaluation (Signed)
Occupational Therapy Evaluation Patient Details Name: Laura Diaz MRN: [id number] DOB: Oct 21, 1922 Today's Date: 06/24/2017    History of Present Illness Pt is a 84 y/o female admitted secondary to increased SOB. Chest imaging revealed cardiomegaly suggesting CHF. PMH includes CVA, dCHF, and vascular dementia.    Clinical Impression   This 84 y/o female presents with the above. At baseline pt ambulates with RW, has home care aide who assists with bathing ADLs 3 days/week. Pt completing stand pivot transfers this session using RW with MinA; completing toilieting and LB dressing with minA throughout, presenting with generalized weakness and decreased activity tolerance. Pt requiring 1L O2 this session to maintain SpO2 >90%. Pt will benefit from continued OT services including HHOT services after discharge, and recommend pt have 24hr assist initially after return home, to maximize her overall safety and independence with ADLs and mobility (pt may benefit from [facility] with [facility] if eligible). Will continue to follow acutely to progress pt towards established OT goals.     Follow Up Recommendations  Home health OT;Supervision/Assistance - 24 hour;Other (comment)(HH Aide; may benefit from [facility] with [facility] if eligible )    Equipment Recommendations  3 in 1 bedside commode;Other (comment)(per PT note, pt's son refusing 3:1)           Precautions / Restrictions Precautions Precautions: Fall Precaution Comments: monitor O2 sats  Restrictions Weight Bearing Restrictions: No      Mobility Bed Mobility               General bed mobility comments: OOB in recliner   Transfers Overall transfer level: Needs assistance Equipment used: Rolling walker (2 wheeled) Transfers: Sit to/from Stand Sit to Stand: Min assist Stand pivot transfers: Min assist       General transfer comment: assist to rise and steady at RW; min steadying assist to pivot recliner<>BSC    Balance  Overall balance assessment: Mild deficits observed, not formally tested Sitting-balance support: No upper extremity supported;Feet supported Sitting balance-Leahy Scale: Good     Standing balance support: No upper extremity supported;During functional activity Standing balance-Leahy Scale: Fair Standing balance comment: During functional activity; pt stable in static standing without UE support.                           ADL either performed or assessed with clinical judgement   ADL Overall ADL's : Needs assistance/impaired Eating/Feeding: Supervision/ safety;Sitting   Grooming: Wash/dry hands;Set up;Sitting   Upper Body Bathing: Min guard;Sitting   Lower Body Bathing: Minimal assistance;Sit to/from stand   Upper Body Dressing : Min guard;Sitting   Lower Body Dressing: Minimal assistance;Sit to/from stand Lower Body Dressing Details (indicate cue type and reason): pt donning mesh underwear with min steadying assist in standing while pt advances over hips  Toilet Transfer: Minimal assistance;Stand-pivot;BSC;RW   Toileting- Clothing Manipulation and Hygiene: Minimal assistance;Sit to/from stand Toileting - Clothing Manipulation Details (indicate cue type and reason): min steadying assist while pt completes clothing management in standing; assist for gown management; pt incontinent of urine prior to sitting on BSC requiring changing of mesh briefs; completing peri-care using lateral leans with setup assist      Functional mobility during ADLs: Minimal assistance;Rolling walker General ADL Comments: pt initially on RA with SpO2 at 91%, dropped to 87% with transfer to [facility], reapplied 1L supplemental O2 for remainder of session with SpO2 remaining above 90%  Pertinent Vitals/Pain Pain Assessment: No/denies pain          Extremity/Trunk Assessment Upper Extremity Assessment Upper Extremity Assessment: Generalized weakness   Lower  Extremity Assessment Lower Extremity Assessment: Defer to PT evaluation   Cervical / Trunk Assessment Cervical / Trunk Assessment: Kyphotic   Communication     Cognition Arousal/Alertness: Awake/alert Behavior During Therapy: WFL for tasks assessed/performed Overall Cognitive Status: History of cognitive impairments - at baseline                                 General Comments: pt with dementia at baseline; intermittently requires cues to initiate ADL tasks; pt also HOH requiring increased cues/repetition    General Comments  pt's daughter present during session; educated on recommendation for 24hr assist/supervision initially after d/c home                Home Living Family/patient expects to be discharged to:: Private residence Living Arrangements: Children Available Help at Discharge: Family;Personal care attendant;Available PRN/intermittently Type of Home: House Home Access: Stairs to enter [employer] of Steps: 3 Entrance Stairs-Rails: Right;Left;Can reach both Home Layout: One level     Bathroom Shower/Tub: Producer, television/film/video: Standard     Home Equipment: Environmental consultant - 2 wheels;Shower seat   Additional Comments: PCA comes for 3 hours/day, 3 days/week       Prior Functioning/Environment Level of Independence: Needs assistance  Gait / Transfers Assistance Needed: Uses RW for ambulation with supervision.  ADL's / Homemaking Assistance Needed: Requires assist from [facility] for bathing; pt's daughter reports increased difficulty with LB dressing lately             OT Problem List: Decreased strength;Impaired balance (sitting and/or standing);Decreased activity tolerance;Cardiopulmonary status limiting activity      OT Treatment/Interventions: Self-care/ADL training;DME and/or AE instruction;Therapeutic activities;Balance training;Therapeutic exercise;Patient/family education;Energy conservation    OT Goals(Current goals can  be found in the care plan section) Acute Rehab OT Goals Patient Stated Goal: to go home  OT Goal Formulation: With patient/family Time For Goal Achievement: 07/08/17 Potential to Achieve Goals: Good  OT Frequency: Min 2X/week                             AM-PAC PT "6 Clicks" Daily Activity     Outcome Measure Help from another person eating meals?: None Help from another person taking care of personal grooming?: A Little Help from another person toileting, which includes using toliet, bedpan, or urinal?: A Little Help from another person bathing (including washing, rinsing, drying)?: A Little Help from another person to put on and taking off regular upper body clothing?: A Little Help from another person to put on and taking off regular lower body clothing?: A Little 6 Click Score: 19   End of Session Equipment Utilized During Treatment: Gait belt;Rolling walker;Oxygen Nurse Communication: Mobility status;Other (comment)(need for purewick replaced )  Activity Tolerance: Patient tolerated treatment well Patient left: in chair;with call bell/phone within reach;with chair alarm set;with family/visitor present;with nursing/sitter in room  OT Visit Diagnosis: Muscle weakness (generalized) (M62.81)                Time: 9629-5284 OT Time Calculation (min): 32 min Charges:  OT General Charges $OT Visit: 1 Visit OT Evaluation $OT Eval Moderate Complexity: 1 Mod OT Treatments $Self Care/Home Management : 8-22 mins G-Codes:  Marcy Siren, OT Pager [phone] 06/24/2017   Laura Diaz 06/24/2017, 3:38 PM

## 2017-06-25 DIAGNOSIS — I5032 Chronic diastolic (congestive) heart failure: Secondary | ICD-10-CM

## 2017-06-25 LAB — BASIC METABOLIC PANEL
Anion gap: 12 (ref 5–15)
BUN: 16 mg/dL (ref 6–20)
CO2: 30 mmol/L (ref 22–32)
Calcium: 8.6 mg/dL — ABNORMAL LOW (ref 8.9–10.3)
Chloride: 95 mmol/L — ABNORMAL LOW (ref 101–111)
Creatinine, Ser: 1.13 mg/dL — ABNORMAL HIGH (ref 0.44–1.00)
GFR calc Af Amer: 47 mL/min — ABNORMAL LOW (ref >60)
GFR calc non Af Amer: 41 mL/min — ABNORMAL LOW (ref >60)
Glucose, Bld: 100 mg/dL — ABNORMAL HIGH (ref 65–99)
Potassium: 3.2 mmol/L — ABNORMAL LOW (ref 3.5–5.1)
Sodium: 137 mmol/L (ref 135–145)

## 2017-06-25 MED ORDER — POTASSIUM CHLORIDE CRYS ER 20 MEQ PO TBCR
40.0000 meq | EXTENDED_RELEASE_TABLET | Freq: Once | ORAL | Status: AC
  Administered 2017-06-25: 40 meq via ORAL
  Filled 2017-06-25: qty 2

## 2017-06-25 MED ORDER — FUROSEMIDE 40 MG PO TABS: 40.0000 mg | ORAL_TABLET | Freq: Every day | ORAL | 3 refills | Status: DC

## 2017-06-25 MED ORDER — POTASSIUM CHLORIDE CRYS ER 20 MEQ PO TBCR: 40.0000 meq | EXTENDED_RELEASE_TABLET | Freq: Once | ORAL | Status: DC

## 2017-06-25 MED ORDER — POTASSIUM CHLORIDE ER 20 MEQ PO TBCR: EXTENDED_RELEASE_TABLET | ORAL | 1 refills | Status: DC

## 2017-06-25 NOTE — Progress Notes (Signed)
Pt had HR went down to 34, non-sustained, MD aware and notified, will continue to monitor, Thanks Lavonda Jumbo RN.

## 2017-06-25 NOTE — Care Management Important Message (Signed)
Important Message  Patient Details  Name: Laura Diaz MRN: [id number] Date of Birth: Sep 10, 1922   Medicare Important Message Given:  Yes    [name] 06/25/2017, 2:52 PM

## 2017-06-25 NOTE — Discharge Summary (Signed)
Laura Diaz, is a 84 y.o. female  DOB [birth year]  MRN [id number].  Admission date:  06/22/2017  Admitting Physician  Briscoe Deutscher, MD  Discharge Date:  06/25/2017   Primary MD  Kirby Funk, MD  Recommendations for primary care physician for things to follow:   1)Very low-salt diet advised 2)Weigh yourself daily, call if you gain more than 3 pounds in 1 day or more than 5 pounds in 1 week as your diuretic medications may need to be adjusted 3)Follow-up with Mr  Tereso Newcomer-- PA at cardiology clinic on 06/29/2017 for follow-up 4)Limit your Fluid  intake to no more than 50 ounces (1.5 Liters) per day  5)Repeat BMP/kidney test on 06/29/2017 at cardiology clinic when you see Mr Alben Spittle, [place]   Admission Diagnosis  New onset atrial fibrillation (HCC) [I48.91] Acute on chronic congestive heart failure, unspecified heart failure type (HCC) [I50.9]   Discharge Diagnosis  New onset atrial fibrillation (HCC) [I48.91] Acute on chronic congestive heart failure, unspecified heart failure type (HCC) [I50.9]    Principal Problem:   Acute on chronic diastolic CHF (congestive heart failure) (HCC) Active Problems:   History of stroke   History of CVA (cerebrovascular accident)   Vascular dementia   Atrial fibrillation with slow ventricular response (HCC)   Mild renal insufficiency   Elevated blood pressure reading      Past Medical History:  Diagnosis Date  . Macular degeneration   . Renal disorder    stage 3 per daughter  . Stroke [facility])     Past Surgical History:  Procedure Laterality Date  . HERNIA REPAIR  years ago   umbilical  . PARTIAL HYSTERECTOMY  2006   removed gall bladder       HPI  from the history and physical done on the day of admission:    Chief Complaint: Bilateral leg swelling, SOB   HPI: Laura Diaz is a 85 y.o. female with medical history significant for history of  stroke, diastolic CHF, and vascular dementia, now presenting to the emergency department for evaluation of shortness of breath and bilateral lower extremity edema.  Patient is unable to provide much history due to dementia, but her daughter at the bedside assists.  She had reportedly been in her usual state until approximately 2 weeks ago when family noted the development of swelling of the bilateral legs that has been progressive.  She is also been noted to have increased work of breathing, also progressing over that interval.  She was evaluated in an outpatient clinic today, noted to be in atrial fibrillation which is reportedly new, and directed to the ED for further evaluation.  Patient specifically denies chest pain.   ED Course: Upon arrival to the ED, patient is found to be afebrile, saturating adequately on 2 L/min supplemental oxygen, slightly tachypneic, bradycardic with rate ~50, and hypertensive to 180/90.  EKG features atrial fibrillation with nonspecific IVCD.  Chest x-ray is notable for cardiomegaly with mild diffuse pulmonary interstitial edema.  Chemistry panel  is notable for creatinine 1.23, up from 0.87 last November.  CBC is unremarkable, troponin is normal, and BNP is elevated to 1577.  Patient was given 40 mg IV Lasix in the ED and will be admitted to the telemetry unit for ongoing evaluation and management.       Hospital Course:    Brief summary 84 y.o.femalewith medical history significant forhistory of stroke, diastolic CHF, and vascular dementia admitted on 06/22/2017 with congestive heart failure in the setting of "new onset" A. fib with some bradycardia, echo shows PFO   Plan:-  1)HFpEF--- admitted with acute on chronic diastolic dysfunction CHF, echo showed diastolic dysfunction with PFO, EF 60 to 65%, overall much improved with diuresis, cardiology input appreciated, discharged home on Lasix, No ACEI due to kidney concerns.  Cardiology does not believe the patient  is a good candidate for intervention with regards to the PFO   2)"New Onset" Afib with slow rate--heart rate 30s to 50s, cardiology consult appreciated,, prior history of stroke, currently on Eliquis 5 mg twice daily, however patient has history of falls. ???  If her falls are related to arrhythmia.  Discussed with cardiology team, they  do not believe patient is a good candidate for pacemaker placement at this time, cardiology also discussed this with patient and daughter  3)H/o CVA/TIA/vascular dementia-currently on Eliquis, echo shows PFO which may have contributed to her CVA/TIA  4)Aki--Baseline creatinine 0.8, on admission creatinine 1.23, monitor renal function and electrolytes closely with diuresis, renal function has improved, creatinine is 1.1  5) acute hypoxic respiratory failure-secondary to CHF, supplemental oxygen as ordered  6)Generalized weakness/Debility--- PT OT eval appreciated, recommend home health PT  Code Status : DNR  Disposition Plan  : Home with home health  Consults  : Cardiology     Patient needs oxygen at 2 L/min via nasal cannula continuously with gaseous portability and  conserving device due to hypoxic respiratory failure secondary to congestive heart failure  Patient Saturations on Room Air at Rest = 98  Pt's oxygen saturation  while ambulating at Room air:- 85%  Pt's oxygen saturation at 2l/min oxygen via Nasal Canula :- 96%   Patient needs oxygen at 2 L/min via nasal cannula continuously with gaseous portability and  conserving device due to hypoxic respiratory failure secondary to congestive heart failure  Discharge Condition: stable  Follow UP  Follow-up Information    Tereso Newcomer T, PA-C Follow up.   Specialties:  Cardiology, Physician Assistant Why:  [facility] - Tuesday 06/29/17 at 11:45am. Arrive by 11:30 to check in Lorin Picket is one of the PAs that works closely with our cardiology team. Contact information: [address] [place] [address] [phone]        Kirby Funk, MD. Go on 07/07/2017.   Specialty:  Internal Medicine Why:  :00am Contact information: [address] [place] [address] [phone]        [facility] Follow up.   Specialty:  Home Health Services Why:  Provide home health services. Contact information: [address] [place] [address] [phone]            Diet and Activity recommendation:  As advised  Discharge Instructions     Discharge Instructions    (HEART FAILURE PATIENTS) Call MD:  Anytime you have any of the following symptoms: 1) 3 pound weight gain in 24 hours or 5 pounds in 1 week 2) shortness of breath, with or  without a dry hacking cough 3) swelling in the hands, feet or stomach 4) if you have to sleep on extra pillows at night in order to breathe.   Complete by:  As directed    AMB referral to CHF clinic   Complete by:  As directed    Call MD for:  difficulty breathing, headache or visual disturbances   Complete by:  As directed    Call MD for:  persistant dizziness or light-headedness   Complete by:  As directed    Call MD for:  persistant nausea and vomiting   Complete by:  As directed    Call MD for:  temperature >100.4   Complete by:  As directed    Diet - low sodium heart healthy   Complete by:  As directed    Discharge instructions   Complete by:  As directed    1)Very low-salt diet advised 2)Weigh yourself daily, call if you gain more than 3 pounds in 1 day or more than 5 pounds in 1 week as your diuretic medications may need to be adjusted 3)Follow-up with Mr  Tereso Newcomer-- PA at cardiology clinic on 06/29/2017 for follow-up 4)Limit your Fluid  intake to no more than 50 ounces (1.5 Liters) per day  5)Repeat BMP/kidney test on 06/29/2017 at cardiology clinic when you see Mr Alben Spittle, [place]   Increase activity slowly   Complete by:  As directed         Discharge  Medications     Allergies as of 06/25/2017      Reactions   Pollen Extract Other (See Comments)   EXCESSIVE WATERING OF EYES      Medication List    TAKE these medications   acetaminophen 325 MG tablet Commonly known as:  TYLENOL Take 650 mg by mouth daily as needed.   ELIQUIS 2.5 MG Tabs tablet Generic drug:  apixaban Take 2.5 mg by mouth 2 (two) times daily.   EYLEA 2 MG/0.05ML Soln Generic drug:  Aflibercept 1 drop by Intravitreal route. Into the right eye every 2 months at physician's office   furosemide 40 MG tablet Commonly known as:  LASIX Take 1 tablet (40 mg total) by mouth daily.   Melatonin 3 MG Tabs Take 3 mg by mouth at bedtime.   Potassium Chloride ER 20 MEQ Tbcr 1 tablet on Monday Wednesday Friday and Saturdays   rOPINIRole 1 MG tablet Commonly known as:  REQUIP Take 5 mg by mouth at bedtime.   tobramycin 0.3 % ophthalmic solution Commonly known as:  TOBREX Place 1 drop into the right eye every 4 (four) hours. Use 1 day before and 2 days after procedure   traZODone 50 MG tablet Commonly known as:  DESYREL Take 50 mg by mouth at bedtime.   VISINE OP Place 1 drop into the right eye daily as needed (eye discomfort).            Durable Medical Equipment  (From admission, onward)        Start     Ordered   06/25/17 1239  For home use only DME oxygen  Once    Comments:  Patient needs oxygen at 2 L/min via nasal cannula continuously with gaseous portability and  conserving device due to hypoxic respiratory failure secondary to congestive heart failure  Patient Saturations on Room Air at Rest = 98  Pt's oxygen saturation  while ambulating at Room air:- 85%  Pt's oxygen saturation at 2l/min oxygen via Nasal Canula :-  96%   Patient needs oxygen at 2 L/min via nasal cannula continuously with gaseous portability and  conserving device due to hypoxic respiratory failure secondary to congestive heart failure  Question Answer Comment  Mode or  (Route) Nasal cannula   Liters per Minute 2   Frequency Continuous (stationary and portable oxygen unit needed)   Oxygen conserving device Yes   Oxygen delivery system Gas      06/25/17 1239   06/25/17 1156  For home use only DME Bedside commode  Once    Comments:  Congestive heart failure/dyspnea on exertion with generalized weakness and debility  Question:  Patient needs a bedside commode to treat with the following condition  Answer:  Debility   06/25/17 1155      Major procedures and Radiology Reports - PLEASE review detailed and final reports for all details, in brief -     Dg Chest 2 View  Result Date: 06/22/2017 CLINICAL DATA:  Initial evaluation for acute shortness of breath. EXAM: CHEST - 2 VIEW COMPARISON:  Prior radiograph from 03/17/2014. FINDINGS: Moderate cardiomegaly, stable. Mediastinal silhouette normal. Aortic atherosclerosis. Lungs hypoinflated. Moderate right with small left pleural effusion. Perihilar vascular congestion with diffuse interstitial prominence, suggesting mild pulmonary interstitial edema. Patchy bibasilar opacities favored to reflect atelectatic changes. No other focal infiltrates. No pneumothorax. No acute osseous abnormality. IMPRESSION: 1. Cardiomegaly with mild diffuse pulmonary interstitial edema with right greater than left pleural effusions, suggesting CHF. 2. Associated bibasilar opacities, likely atelectasis. Electronically Signed   By: Rise Mu M.D.   On: 06/22/2017 18:25    Micro Results   No results found for this or any previous visit (from the past 240 hour(s)).     Today   Subjective    Taffy Villarruel today has no new complaints, plan of care discussed with patient and daughter at bedside          Patient has been seen and examined prior to discharge   Objective   Blood pressure (!) 117/57, pulse 66, temperature (!) 97.3 F (36.3 C), temperature source Oral, resp. rate 20, height  (1.626 m), weight 80 kg (176 lb  4.8 oz), SpO2 97 %.   Intake/Output Summary (Last 24 hours) at 06/25/2017 1240 Last data filed at 06/25/2017 0600 Gross per 24 hour  Intake 120 ml  Output 500 ml  Net -380 ml    Exam Gen:- Awake Alert, in no acute distress HEENT:- [place].AT, No sclera icterus Eyes- Macular degeneration with visual defects Ears-hard of hearing Neck-Supple Neck,No JVD,.  Lungs-improved air movement, no wheezing CV- S1, S2 normal, 2/6 systolic murmur, irregularly irregular heart rate 44 Abd-  +ve B.Sounds, Abd Soft, No tenderness,    Extremity/Skin:-Improved lower extremity edema, good pulses  psych-affect is appropriate, some cognitive deficits due to underlying dementia  neuro-no new focal deficits, no tremors     Data Review   CBC w Diff:  Lab Results  Component Value Date   WBC 7.7 06/22/2017   HGB 14.8 06/22/2017   HCT 46.1 (H) 06/22/2017   HCT 46.7 (H) 03/17/2014   PLT 264 06/22/2017   LYMPHOPCT 10 06/22/2017   MONOPCT 9 06/22/2017   EOSPCT 0 06/22/2017   BASOPCT 0 06/22/2017    CMP:  Lab Results  Component Value Date   NA 137 06/25/2017   K 3.2 (L) 06/25/2017   CL 95 (L) 06/25/2017   CO2 30 06/25/2017   BUN 16 06/25/2017   CREATININE 1.13 (H) 06/25/2017   PROT  5.9 (L) 06/22/2017   ALBUMIN 3.5 06/22/2017   BILITOT 1.4 (H) 06/22/2017   ALKPHOS 68 06/22/2017   AST 39 06/22/2017   ALT 44 06/22/2017     Total Discharge time is about 33 minutes  Shon Hale M.D on 06/25/2017 at 12:40 PM  [facility]   Office  [phone]  Voice Recognition Reubin Milan dictation system was used to create this note, attempts have been made to correct errors. Please contact the author with questions and/or clarifications.

## 2017-06-25 NOTE — Progress Notes (Deleted)
Entered in error

## 2017-06-25 NOTE — Progress Notes (Signed)
Occupational Therapy Treatment Patient Details Name: Laura Diaz MRN: [id number] DOB: 1922/06/11 Today's Date: 06/25/2017    History of present illness Pt is a 84 y/o female admitted secondary to increased SOB. Chest imaging revealed cardiomegaly suggesting CHF. PMH includes CVA, dCHF, and vascular dementia.    OT comments  Pt able to perform functional mobility with toilet transfer with min guard assist and use of RW. DOE 2/4; SpO2 94% on RA. Educated pt and family on energy conservation strategies and pursed lip breathing. D/c plan remains appropriate. Will continue to follow acutely.   Follow Up Recommendations  Home health OT;Supervision/Assistance - 24 hour;Other (comment)    Equipment Recommendations  3 in 1 bedside commode    Recommendations for Other Services      Precautions / Restrictions Precautions Precautions: Fall Precaution Comments: monitor O2 sats  Restrictions Weight Bearing Restrictions: No       Mobility Bed Mobility               General bed mobility comments: Pt OOB in chair upon arrival  Transfers Overall transfer level: Needs assistance Equipment used: Rolling walker (2 wheeled) Transfers: Sit to/from Stand Sit to Stand: Min guard         General transfer comment: Cues for hand placement, min guard for safety    Balance Overall balance assessment: Needs assistance Sitting-balance support: No upper extremity supported;Feet supported Sitting balance-Leahy Scale: Good     Standing balance support: No upper extremity supported;During functional activity Standing balance-Leahy Scale: Fair                             ADL either performed or assessed with clinical judgement   ADL Overall ADL's : Needs assistance/impaired     Grooming: Min guard;Standing;Wash/dry Lawyer: Min guard;Ambulation;RW;Regular Social worker and Hygiene: Min guard;Sit  to/from stand       Functional mobility during ADLs: Min guard;Rolling walker General ADL Comments: SpO2 94% on RA with activity, DOE 2/4. Educated pt and family on energy conservaion strategies and pursed lip breathing. Discussed use of RW at all times for fall prevention.     Vision       Perception     Praxis      Cognition Arousal/Alertness: Awake/alert Behavior During Therapy: WFL for tasks assessed/performed Overall Cognitive Status: History of cognitive impairments - at baseline                                          Exercises     Shoulder Instructions       General Comments      Pertinent Vitals/ Pain       Pain Assessment: No/denies pain  Home Living                                          Prior Functioning/Environment              Frequency  Min 2X/week        Progress Toward Goals  OT Goals(current goals can now be found in the care plan section)  Progress towards OT  goals: Progressing toward goals  Acute Rehab OT Goals Patient Stated Goal: home today OT Goal Formulation: With patient/family  Plan Discharge plan remains appropriate    Co-evaluation                 AM-PAC PT "6 Clicks" Daily Activity     Outcome Measure   Help from another person eating meals?: None Help from another person taking care of personal grooming?: A Little Help from another person toileting, which includes using toliet, bedpan, or urinal?: A Little Help from another person bathing (including washing, rinsing, drying)?: A Little Help from another person to put on and taking off regular upper body clothing?: A Little Help from another person to put on and taking off regular lower body clothing?: A Little 6 Click Score: 19    End of Session Equipment Utilized During Treatment: Rolling walker  OT Visit Diagnosis: Muscle weakness (generalized) (M62.81)   Activity Tolerance Patient tolerated treatment well    Patient Left in chair;with call bell/phone within reach;with family/visitor present   Nurse Communication          Time: 1610-9604 OT Time Calculation (min): 19 min  Charges: OT General Charges $OT Visit: 1 Visit OT Treatments $Self Care/Home Management : 8-22 mins  Carlester Kasparek A. Brett Albino, M.S., OTR/L Acute Rehab Department: [phone]   Gaye Alken 06/25/2017, 2:18 PM

## 2017-06-25 NOTE — Care Management Note (Addendum)
Case Management Note  Patient Details  Name: Laura Diaz MRN: [id number] Date of Birth: 1922/08/09  Subjective/Objective:                    Action/Plan:  Spoke to patient, son and daughter at bedside.Patient lives with son and has aides 3 times a week for 3 hours at a time.  Explained [facility] home first program. THey are interested to see if patient meets criteria .Provided choice they want [facility].   Eliquis 30 day free card given and explained. Patient was on Eliquis prior , but has not used 30 day free card.   Patient currently on oxygen , she does not have oxygen at home. Spoke with bedside nurse Rekha [phone] , she will check saturation to see if patient needs home oxygen. Rekha called patient 95% on room air . Asked her to document ambulatory saturation note. See note. Spoke to MD he is going to order home oxygen. Jermain with [facility] aware. Expected Discharge Date:  06/25/17               Expected Discharge Plan:  Home w Home Health Services  In-House Referral:  NA  Discharge planning Services  CM Consult  Post Acute Care Choice:  Home Health Choice offered to:  Patient, Adult Children  DME Arranged:  3-N-1 DME Agency:  [facility]  HH Arranged:  RN, PT [facility] Agency:  [facility]  Status of Service:  In process, will continue to follow  If discussed at Long Length of Stay Meetings, dates discussed:    Additional Comments:  Kingsley Plan, RN 06/25/2017, 12:02 PM

## 2017-06-25 NOTE — Progress Notes (Signed)
Progress Note  Patient Name: Laura Diaz Date of Encounter: 06/25/2017  Primary Cardiologist: No primary care provider on file.   Subjective   She feels well this am, improved SOB.  Inpatient Medications    Scheduled Meds: . apixaban  5 mg Oral BID  . furosemide  40 mg Intravenous Q12H  . potassium chloride  40 mEq Oral Once  . rOPINIRole  5 mg Oral QHS  . sodium chloride flush  3 mL Intravenous Q12H  . traZODone  50 mg Oral QHS   Continuous Infusions: . sodium chloride     PRN Meds: sodium chloride, acetaminophen, ALPRAZolam, hydrALAZINE, hydroxypropyl methylcellulose / hypromellose, Melatonin, ondansetron (ZOFRAN) IV, sodium chloride flush   Vital Signs    Vitals:   06/24/17 2023 06/24/17 2151 06/25/17 0142 06/25/17 0500  BP: 104/62 130/70 127/76 (!) 145/80  Pulse: 64 (!) 49 60 64  Resp: 17  20 20   Temp: 98.4 F (36.9 C)  97.7 F (36.5 C) 97.6 F (36.4 C)  TempSrc: Oral  Oral Oral  SpO2: 96% 93% 98% 98%  Weight:    176 lb 4.8 oz (80 kg)  Height:        Intake/Output Summary (Last 24 hours) at 06/25/2017 1121 Last data filed at 06/25/2017 0600 Gross per 24 hour  Intake 120 ml  Output 500 ml  Net -380 ml   Filed Weights   06/23/17 1534 06/24/17 0705 06/25/17 0500  Weight: 182 lb 9.6 oz (82.8 kg) 184 lb (83.5 kg) 176 lb 4.8 oz (80 kg)    Telemetry    A-fib with slow ventricular response- Personally Reviewed  Physical Exam   GEN: No acute distress.   Neck: No JVD Cardiac: iRRR, no murmurs, rubs, or gallops.  Respiratory: Clear to auscultation bilaterally. GI: Soft, nontender, non-distended  MS: No edema; No deformity. Neuro:  Nonfocal  Psych: Normal affect   Labs    Chemistry Recent Labs  Lab 06/22/17 1645 06/23/17 0415 06/24/17 1107 06/25/17 0450  NA 144 144 141 137  K 4.6 4.1 3.6 3.2*  CL 111 105 99* 95*  CO2 22 28 32 30  GLUCOSE 100* 113* 97 100*  BUN 20 20 16 16   CREATININE 1.23* 1.25* 1.23* 1.13*  CALCIUM 9.1 8.9 9.0 8.6*   PROT 5.9*  --   --   --   ALBUMIN 3.5  --   --   --   AST 39  --   --   --   ALT 44  --   --   --   ALKPHOS 68  --   --   --   BILITOT 1.4*  --   --   --   GFRNONAA 37* 36* 37* 41*  GFRAA 42* 42* 42* 47*  ANIONGAP 11 11 10 12      Hematology Recent Labs  Lab 06/22/17 1645  WBC 7.7  RBC 4.79  HGB 14.8  HCT 46.1*  MCV 96.2  MCH 30.9  MCHC 32.1  RDW 13.6  PLT 264    Cardiac EnzymesNo results for input(s): TROPONINI in the last 168 hours.  Recent Labs  Lab 06/22/17 1701  TROPIPOC 0.06     BNP Recent Labs  Lab 06/22/17 1646  BNP 1,577.0*     DDimer No results for input(s): DDIMER in the last 168 hours.   Radiology    No results found.  Cardiac Studies     Patient Profile     84 yo female with PMH  of CVA, diastolic CHF, dementia who presented with new onset of Afib and acute on chronic diastolic HF.   Assessment & Plan    1. New onset Afib: Unclear duration as patient was not symptomatic until over the weekend when she develop HF symptoms. She has been on reduced dose eliquis for her hx of CVA/TIA. Remains in Afib with slow ventricular rate, down to 30', but mostly 40-50', no pauses > 3 seconds. No recent falls or dizziness, distant h/o falls. This patients CHA2DS2-VASc Score of 7  (Weight > 80 kg, Crea < 1.5). Eliquis was increased to 5 mg PO BID this admission given her weight and Crea < 1.5, however GFR 41. It is reasonable that in a 84 year old with h/o falls we decrease Eliquis to 2.5 mg po bid or Xarelto 15 mg po daily. Regarding slow ventricular response - she currently doesn't meet criteria for a PM placement, however if she develops symptoms - dizziness, falls, or develops significant pauses, recurrent CHF we will re-evaluate.  We will follow closely, next appointment is scheduled for Tuesday 06/30/2017 in our clinic. She can be discharged today.  2. Acute on Chronic diastolic HF: echo this admission showed normal EF. BNP >1500 on admission, She was  started on lasix 40 mg iv BID with good response - 10 lbs down in 2 days. She appears euvolemic, currently 176 lbs, but 195 lbs in Nov 2018, 183 in 2016.  I would switch lasix iv to 40 mg PO daily. (none prior to admission)   3. PFO: noted on echo. Suspect this could have played a role in her previous TIA/CVA. At this time suspect she may not be a good candidate for intervention given her age and dementia.   4. AKI: 1.23 today. Monitor with diuresis.   For questions or updates, please contact [facility] Please consult [email or web address] for contact info under Cardiology/STEMI.     Signed, Tobias AlexanderKatarina Camellia Popescu, MD  06/25/2017, 11:21 AM

## 2017-06-25 NOTE — Progress Notes (Addendum)
SATURATION QUALIFICATIONS: (This note is used to comply with regulatory documentation for home oxygen)  Patient Saturations on Room Air at Rest = 98  Pt's oxygen saturation  while ambulating at Room air:- 85%  Pt's oxygen saturation at 2l/min oxygen via Nasal Canula :- 96%  Pt is stable, denies chest pain and SOB  Lonia Farber, RN

## 2017-06-25 NOTE — Progress Notes (Signed)
Dr. Delton SeeNelson requested we arrange close early f/u with this patient - no appt availability with care team so arranged with APP Alben SpittleWeaver Tuesday 06/29/17 at 11:45am. Dr.Nelson will be communicating with primary team regarding recs - she reviewed case with Dr. Johney FrameAllred who agreed lower dose anticoagulation is probably the most appropriate and he felt given her lack of sx she does not currently meet any criteria for pacer. Dr. Delton SeeNelson does feel it would be helpful for her to have close OP f/u to make sure she is doing OK, hence the appt above. Dayna Dunn PA-C

## 2017-06-25 NOTE — Care Management (Signed)
S/W CASEY @ [facility] # [id number]     ELIQUIS 5 MG BID  COVER- YES  CO-PAY- $ 47.00  TIER- 3 DRUG  PRIOR APPROVAL- NO   PREFERRED PHARMACY : YES - [facility], [facility], [facility] AND [facility]

## 2017-06-25 NOTE — Progress Notes (Signed)
Pt got discharged to home, discharge instructions provided and patient showed understanding to it, IV taken out,Telemonitor DC,pt left unit in wheelchair with all of the belongings accompanied with a family member (Son and daughter), oxygen and shower chair provided through home health.  Lonia Farber, RN

## 2017-06-26 DIAGNOSIS — I5033 Acute on chronic diastolic (congestive) heart failure: Secondary | ICD-10-CM | POA: Diagnosis not present

## 2017-06-26 DIAGNOSIS — I130 Hypertensive heart and chronic kidney disease with heart failure and stage 1 through stage 4 chronic kidney disease, or unspecified chronic kidney disease: Secondary | ICD-10-CM | POA: Diagnosis not present

## 2017-06-28 ENCOUNTER — Other Ambulatory Visit: Payer: Self-pay | Admitting: *Deleted

## 2017-06-28 DIAGNOSIS — I130 Hypertensive heart and chronic kidney disease with heart failure and stage 1 through stage 4 chronic kidney disease, or unspecified chronic kidney disease: Secondary | ICD-10-CM | POA: Diagnosis not present

## 2017-06-28 DIAGNOSIS — I5033 Acute on chronic diastolic (congestive) heart failure: Secondary | ICD-10-CM | POA: Diagnosis not present

## 2017-06-28 NOTE — Patient Outreach (Signed)
Confirmed with [name] with [facility] that patient enrolled in the [facility].  Telephone call made to [name] (son) [phone] to make aware that [facility] Care Management could assist if pharmacy or transportation needs are identified while on the [facility] program. [name] denies any transportation or pharmacy needs.  Discussed that [facility] Care Management may receive referral at later time if community case management needs are identified post [facility]  Program. [name] expresses understanding and appreciation of the call.  Will make [facility] office aware of [name].  Of note patient's names is listed as Laura ShinglesMarie Diaz on the all payer's list.  Raiford Nobletika Shi Blankenship, MSN-Ed, RN,BSN [facility] Care [facility] Hospital Liaison [phone]

## 2017-06-29 ENCOUNTER — Encounter: Payer: Self-pay | Admitting: Physician Assistant

## 2017-06-29 ENCOUNTER — Ambulatory Visit (INDEPENDENT_AMBULATORY_CARE_PROVIDER_SITE_OTHER): Payer: Medicare Other | Admitting: Physician Assistant

## 2017-06-29 VITALS — BP 166/84 | HR 54 | Ht 64.0 in | Wt 175.8 lb

## 2017-06-29 DIAGNOSIS — I4819 Other persistent atrial fibrillation: Secondary | ICD-10-CM

## 2017-06-29 DIAGNOSIS — I130 Hypertensive heart and chronic kidney disease with heart failure and stage 1 through stage 4 chronic kidney disease, or unspecified chronic kidney disease: Secondary | ICD-10-CM | POA: Diagnosis not present

## 2017-06-29 DIAGNOSIS — I10 Essential (primary) hypertension: Secondary | ICD-10-CM | POA: Diagnosis not present

## 2017-06-29 DIAGNOSIS — Z8673 Personal history of transient ischemic attack (TIA), and cerebral infarction without residual deficits: Secondary | ICD-10-CM | POA: Diagnosis not present

## 2017-06-29 DIAGNOSIS — I481 Persistent atrial fibrillation: Secondary | ICD-10-CM

## 2017-06-29 DIAGNOSIS — N183 Chronic kidney disease, stage 3 unspecified: Secondary | ICD-10-CM

## 2017-06-29 DIAGNOSIS — I340 Nonrheumatic mitral (valve) insufficiency: Secondary | ICD-10-CM

## 2017-06-29 DIAGNOSIS — I5033 Acute on chronic diastolic (congestive) heart failure: Secondary | ICD-10-CM | POA: Diagnosis not present

## 2017-06-29 DIAGNOSIS — I5032 Chronic diastolic (congestive) heart failure: Secondary | ICD-10-CM

## 2017-06-29 HISTORY — DX: Chronic kidney disease, stage 3 unspecified: N18.30

## 2017-06-29 HISTORY — DX: Nonrheumatic mitral (valve) insufficiency: I34.0

## 2017-06-29 NOTE — Progress Notes (Signed)
Cardiology Office Note:    Date:  06/29/2017   ID:  Laura Diaz, DOB 1922/06/10, MRN [id number]  PCP:  Kirby FunkGriffin, John, MD  Cardiologist:  Tobias AlexanderKatarina Nelson, MD   Referring MD: Kirby FunkGriffin, John, MD   Chief Complaint  Patient presents with  . Hospitalization Follow-up    CHF, AFib    History of Present Illness:    Laura Diaz is a 84 y.o. female with diastolic heart failure, prior stroke, dementia.  She was admitted 5/20-5/31 with decompensated heart failure and new onset atrial fibrillation.  Heart rate was controlled.  Echocardiogram demonstrated normal LV function, mild to moderate mitral regurgitation and PASP 49.  The patient was on Eliquis since her prior stroke.  A PFO was noted on her Echo.    Laura Diaz returns for follow up.  She is here with her daughter.  Since DC, she is doing ok.  Her breathing remains improved.  She denies paroxysmal nocturnal dyspnea.  She still notes orthopnea.  Her leg swelling remains improved.  Her weight is stable.  She is not having any chest pain.  She denies dizziness, syncope.  She denies any bleeding issues.    Prior CV studies:   The following studies were reviewed today:  Echo 06/23/17 Mild LVH, EF 60-65, normal wall motion, aortic sclerosis (no aortic stenosis), mild to moderate MR, moderate LAE, mildly reduced RVSF, patent foramen ovale with left-to-right shunting, mild to moderate TR, PASP 49  Echo 03/18/2014 Mild LVH, EF 55-60, normal wall motion, grade 1 diastolic dysfunction  Carotid US 03/18/2014 Summary: Bilateral: intimal wall thickening CCA. Mild mixed plaque origin ICA. 1-39% ICA stenosis. Vertebral artery flow is antegrade.   Past Medical History:  Diagnosis Date  . Chronic diastolic CHF (congestive heart failure) (HCC) 06/22/2017   Admx 5/19 with a/c diast CHF // Echo 5/19: Mild LVH, EF 60-65, normal wall motion, aortic sclerosis (no aortic stenosis), mild to moderate MR, moderate LAE, mildly reduced RVSF, patent foramen  ovale with left-to-right shunting, mild to moderate TR, PASP 49 // Echo 02/2014:  Mild LVH, EF 55-60, normal wall motion, grade 1 diastolic dysfunction  . CKD (chronic kidney disease) stage 3, GFR 30-59 ml/min (HCC) 06/29/2017  . History of CVA (cerebrovascular accident) 11/28/2016  . Macular degeneration   . Mitral valve insufficiency 06/29/2017   Mild to mod by Echo 5/19  . Persistent atrial fibrillation (HCC) 06/22/2017  . PFO (patent foramen ovale)    noted on Echo 5/19   Surgical Hx: The patient  has a past surgical history that includes Hernia repair (years ago) and Partial hysterectomy (2006).   Current Medications: Current Meds  Medication Sig  . acetaminophen (TYLENOL) 325 MG tablet Take 650 mg by mouth daily as needed.  . Aflibercept (EYLEA) 2 MG/0.05ML SOLN 1 drop by Intravitreal route. Into the right eye every 2 months at physician's office   . apixaban (ELIQUIS) 2.5 MG TABS tablet Take 2.5 mg by mouth 2 (two) times daily.  . furosemide (LASIX) 40 MG tablet Take 1 tablet (40 mg total) by mouth daily.  . Melatonin 3 MG TABS Take 3 mg by mouth at bedtime.  . Potassium Chloride ER 20 MEQ TBCR 1 tablet on Monday Wednesday Friday and Saturdays  . rOPINIRole (REQUIP) 1 MG tablet Take 1 mg by mouth at bedtime.   . Tetrahydrozoline HCl (VISINE OP) Place 1 drop into the right eye daily as needed (eye discomfort).  Marland Kitchen. tobramycin (TOBREX) 0.3 % ophthalmic solution Place 1  drop into the right eye every 4 (four) hours. Use 1 day before and 2 days after procedure  . traZODone (DESYREL) 50 MG tablet Take 50 mg by mouth at bedtime.     Allergies:   Pollen extract   Social History   Tobacco Use  . Smoking status: Former Games developer  . Smokeless tobacco: Never Used  . Tobacco comment: many years ago  Substance Use Topics  . Alcohol use: No  . Drug use: No     Family Hx: The patient's family history includes Breast cancer in her sister; Hypertension in her son; Parkinson's disease in her  sister; Stroke in her father.  ROS:   Please see the history of present illness.    Review of Systems  HENT: Positive for hearing loss.   Eyes: Positive for visual disturbance.  Cardiovascular: Positive for dyspnea on exertion, leg swelling and palpitations.  Respiratory: Positive for shortness of breath and snoring.   Hematologic/Lymphatic: Bruises/bleeds easily.  Musculoskeletal: Positive for back pain.  Gastrointestinal: Positive for diarrhea.  Neurological: Positive for loss of balance.  Psychiatric/Behavioral: Positive for depression.   All other systems reviewed and are negative.   EKGs/Labs/Other Test Reviewed:    EKG:  EKG is  ordered today.  The ekg ordered today demonstrates atrial fibrillation, heart rate 54, right axis deviation, T wave inversions 2, 3, aVF, V3-V6, QTC 441, similar to prior tracings  Recent Labs: 06/22/2017: ALT 44; B Natriuretic Peptide 1,577.0; Hemoglobin 14.8; Magnesium 2.2; Platelets 264; TSH 6.628 06/25/2017: BUN 16; Creatinine, Ser 1.13; Potassium 3.2; Sodium 137   Recent Lipid Panel Lab Results  Component Value Date/Time   CHOL 209 (H) 03/18/2014 07:20 AM   TRIG 118 03/18/2014 07:20 AM   HDL 36 (L) 03/18/2014 07:20 AM   CHOLHDL 5.8 03/18/2014 07:20 AM   LDLCALC 149 (H) 03/18/2014 07:20 AM    Physical Exam:    VS:  BP (!) 166/84   Pulse (!) 54   Ht 5\' 4"  (1.626 m)   Wt 175 lb 12.8 oz (79.7 kg)   BMI 30.18 kg/m     Wt Readings from Last 3 Encounters:  06/29/17 175 lb 12.8 oz (79.7 kg)  06/25/17 176 lb 4.8 oz (80 kg)  11/28/16 195 lb 15.8 oz (88.9 kg)     Physical Exam  Constitutional: She is oriented to person, place, and time. She appears well-developed and well-nourished. No distress.  HENT:  Head: Normocephalic and atraumatic.  Neck: Neck supple.  Cardiovascular: Normal rate, regular rhythm, S1 normal, S2 normal and normal heart sounds.  No murmur heard. Pulmonary/Chest: Effort normal. She has no rales.  Abdominal: Soft.    Musculoskeletal: She exhibits edema (trace-1+ bilat LE edema).  Neurological: She is alert and oriented to person, place, and time.  Skin: Skin is warm and dry.    ASSESSMENT & PLAN:    Chronic diastolic CHF (congestive heart failure) (HCC) EF 60-65 by echo 5/19.  She is doing well since DC from the hospital.  Her volume appears stable.  Continue current dose of Lasix.  Obtain BMET today.  Persistent atrial fibrillation (HCC) CHADS2-VASc=7 (age x 2, CVA x 2, female, CHF, HTN).  Rate is controlled.  She had slow HRs in the hospital.  Notes indicate that her case was reviewed with EP and there was no indication for pacemaker implantation.  Her ideal dose of Eliquis is 5 mg Twice daily.   However, this was also reviewed with EP.  Her creatinine clearance is 39.  Given her hx of frequent falls, it was felt that it is more appropriate to have her on Eliquis 2.5 mg Twice daily.    History of CVA (cerebrovascular accident) PFO noted on echo.  She is not likely a candidate for closure. She is currently on anticoagulation.  Essential hypertension Her BP is above target.  We need to avoid AV nodal blocking agents given bradycardia.  I am hesitant to use Amlodipine as there is a small chance it can contribute to slower HRs.  She does have CKD and would likely benefit from ACE inhibitor.    -Obtain BMET today  -If creatinine stable, I will add Lisinopril 5 mg Once daily.   CKD (chronic kidney disease) stage 3, GFR 30-59 ml/min (HCC) Obtain BMET today.  Mitral valve insufficiency, unspecified etiology Mild to mod by Echo 5/19.   Dispo:  Return in about 6 weeks (around 08/10/2017) for Close Follow Up w/ Dr. Delton See, or Tereso Newcomer, PA-C.   Medication Adjustments/Labs and Tests Ordered: Current medicines are reviewed at length with the patient today.  Concerns regarding medicines are outlined above.  Tests Ordered: Orders Placed This Encounter  Procedures  . Basic Metabolic Panel (BMET)  . EKG  12-Lead   Medication Changes: No orders of the defined types were placed in this encounter.   Signed, Tereso Newcomer, PA-C  06/29/2017 1:18 PM    [facility] [address] [place], [place], [place]  [address] Phone: [phone]; Fax: [phone]

## 2017-06-29 NOTE — Patient Instructions (Addendum)
Medication Instructions:  Your physician recommends that you continue on your current medications as directed. Please refer to the Current Medication list given to you today.   Labwork: TODAY BMET  Testing/Procedures: NONE ORDERED TODAY  Follow-Up: Tereso NewcomerSCOTT WEAVER, [facility] ON August 16, 2017 @ 3:15 PM   Any Other Special Instructions Will Be Listed Below (If Applicable).  MONITOR WEIGHT DAILY AND CALL IF YOUR WEIGHT IS UP 3 LB'S IN 1 DAY [phone]   If you need a refill on your cardiac medications before your next appointment, please call your pharmacy.

## 2017-06-30 DIAGNOSIS — I5033 Acute on chronic diastolic (congestive) heart failure: Secondary | ICD-10-CM | POA: Diagnosis not present

## 2017-06-30 DIAGNOSIS — I130 Hypertensive heart and chronic kidney disease with heart failure and stage 1 through stage 4 chronic kidney disease, or unspecified chronic kidney disease: Secondary | ICD-10-CM | POA: Diagnosis not present

## 2017-06-30 LAB — BASIC METABOLIC PANEL
BUN/Creatinine Ratio: 18 (ref 12–28)
BUN: 25 mg/dL (ref 10–36)
CO2: 27 mmol/L (ref 20–29)
Calcium: 9.7 mg/dL (ref 8.7–10.3)
Chloride: 103 mmol/L (ref 96–106)
Creatinine, Ser: 1.37 mg/dL — ABNORMAL HIGH (ref 0.57–1.00)
GFR calc Af Amer: 38 mL/min/{1.73_m2} — ABNORMAL LOW (ref >59)
GFR calc non Af Amer: 33 mL/min/{1.73_m2} — ABNORMAL LOW (ref >59)
Glucose: 91 mg/dL (ref 65–99)
Potassium: 4.3 mmol/L (ref 3.5–5.2)
Sodium: 146 mmol/L — ABNORMAL HIGH (ref 134–144)

## 2017-07-01 ENCOUNTER — Telehealth: Payer: Self-pay | Admitting: *Deleted

## 2017-07-01 DIAGNOSIS — I130 Hypertensive heart and chronic kidney disease with heart failure and stage 1 through stage 4 chronic kidney disease, or unspecified chronic kidney disease: Secondary | ICD-10-CM | POA: Diagnosis not present

## 2017-07-01 DIAGNOSIS — I5033 Acute on chronic diastolic (congestive) heart failure: Secondary | ICD-10-CM | POA: Diagnosis not present

## 2017-07-01 DIAGNOSIS — N183 Chronic kidney disease, stage 3 unspecified: Secondary | ICD-10-CM

## 2017-07-01 DIAGNOSIS — I5032 Chronic diastolic (congestive) heart failure: Secondary | ICD-10-CM

## 2017-07-01 MED ORDER — POTASSIUM CHLORIDE ER 10 MEQ PO TBCR: 10.0000 meq | EXTENDED_RELEASE_TABLET | Freq: Every day | ORAL | 3 refills | Status: DC

## 2017-07-01 MED ORDER — LISINOPRIL 5 MG PO TABS: 5.0000 mg | ORAL_TABLET | Freq: Every day | ORAL | 3 refills | Status: DC

## 2017-07-01 MED ORDER — FUROSEMIDE 20 MG PO TABS: 20.0000 mg | ORAL_TABLET | Freq: Every day | ORAL | 3 refills | Status: DC

## 2017-07-01 NOTE — Telephone Encounter (Signed)
-----   Message from Beatrice LecherScott T Weaver, PA-C sent at 06/30/2017  5:16 PM EDT ----- Potassium normal.  Creatinine somewhat elevated. PLAN:  1.  Decrease Lasix to 20 mg daily 2.  Decrease dose of potassium to 10 mEq every Monday, Wednesday, Friday 3.  3 days after decreasing Lasix, start lisinopril 5 mg daily for blood pressure 4.  BMET 1 week 5.  Weigh daily.  Call if weight increases >/= 3 pounds in 1 day or increased swelling Tereso NewcomerScott Weaver, PA-C    06/30/2017 5:13 PM

## 2017-07-01 NOTE — Telephone Encounter (Signed)
DPR ok to s/w pt's daughter Lupita LeashDonna who has been advised of pt's alb results. I have gone over medication changes and recommendations: decrease lasix to 20 mg daily, decrease K+ 10 meq daily, after 3 days from the increased lasix pt is to start lisinopril 5 mg daily, bmet 6/13, weigh daily and call if wt is up 3 lb's x 1 day or increased swelling. New Rx's have been sent in.

## 2017-07-03 DIAGNOSIS — I5033 Acute on chronic diastolic (congestive) heart failure: Secondary | ICD-10-CM | POA: Diagnosis not present

## 2017-07-03 DIAGNOSIS — I130 Hypertensive heart and chronic kidney disease with heart failure and stage 1 through stage 4 chronic kidney disease, or unspecified chronic kidney disease: Secondary | ICD-10-CM | POA: Diagnosis not present

## 2017-07-05 DIAGNOSIS — I5033 Acute on chronic diastolic (congestive) heart failure: Secondary | ICD-10-CM | POA: Diagnosis not present

## 2017-07-05 DIAGNOSIS — I130 Hypertensive heart and chronic kidney disease with heart failure and stage 1 through stage 4 chronic kidney disease, or unspecified chronic kidney disease: Secondary | ICD-10-CM | POA: Diagnosis not present

## 2017-07-07 DIAGNOSIS — N183 Chronic kidney disease, stage 3 (moderate): Secondary | ICD-10-CM | POA: Diagnosis not present

## 2017-07-07 DIAGNOSIS — R030 Elevated blood-pressure reading, without diagnosis of hypertension: Secondary | ICD-10-CM | POA: Diagnosis not present

## 2017-07-07 DIAGNOSIS — I5033 Acute on chronic diastolic (congestive) heart failure: Secondary | ICD-10-CM | POA: Diagnosis not present

## 2017-07-07 DIAGNOSIS — I5032 Chronic diastolic (congestive) heart failure: Secondary | ICD-10-CM | POA: Diagnosis not present

## 2017-07-07 DIAGNOSIS — I481 Persistent atrial fibrillation: Secondary | ICD-10-CM | POA: Diagnosis not present

## 2017-07-07 DIAGNOSIS — I130 Hypertensive heart and chronic kidney disease with heart failure and stage 1 through stage 4 chronic kidney disease, or unspecified chronic kidney disease: Secondary | ICD-10-CM | POA: Diagnosis not present

## 2017-07-08 ENCOUNTER — Other Ambulatory Visit: Payer: Medicare Other | Admitting: *Deleted

## 2017-07-08 DIAGNOSIS — I5032 Chronic diastolic (congestive) heart failure: Secondary | ICD-10-CM | POA: Diagnosis not present

## 2017-07-08 DIAGNOSIS — N183 Chronic kidney disease, stage 3 unspecified: Secondary | ICD-10-CM

## 2017-07-08 DIAGNOSIS — I5033 Acute on chronic diastolic (congestive) heart failure: Secondary | ICD-10-CM | POA: Diagnosis not present

## 2017-07-08 DIAGNOSIS — I130 Hypertensive heart and chronic kidney disease with heart failure and stage 1 through stage 4 chronic kidney disease, or unspecified chronic kidney disease: Secondary | ICD-10-CM | POA: Diagnosis not present

## 2017-07-08 LAB — BASIC METABOLIC PANEL
BUN/Creatinine Ratio: 12 (ref 12–28)
BUN: 16 mg/dL (ref 10–36)
CO2: 22 mmol/L (ref 20–29)
Calcium: 9.2 mg/dL (ref 8.7–10.3)
Chloride: 106 mmol/L (ref 96–106)
Creatinine, Ser: 1.32 mg/dL — ABNORMAL HIGH (ref 0.57–1.00)
GFR calc Af Amer: 40 mL/min/{1.73_m2} — ABNORMAL LOW (ref >59)
GFR calc non Af Amer: 35 mL/min/{1.73_m2} — ABNORMAL LOW (ref >59)
Glucose: 103 mg/dL — ABNORMAL HIGH (ref 65–99)
Potassium: 4.4 mmol/L (ref 3.5–5.2)
Sodium: 143 mmol/L (ref 134–144)

## 2017-07-09 ENCOUNTER — Telehealth: Payer: Self-pay | Admitting: *Deleted

## 2017-07-09 DIAGNOSIS — N183 Chronic kidney disease, stage 3 unspecified: Secondary | ICD-10-CM

## 2017-07-09 DIAGNOSIS — I5032 Chronic diastolic (congestive) heart failure: Secondary | ICD-10-CM

## 2017-07-09 NOTE — Telephone Encounter (Signed)
DPR ok to s/w pt's daughter Lupita LeashDonna who has been advised of pt's lab results by phone with verbal understanding. Pt's daughter wanted to know if we could get the lab work [facility]BMET[facility] Tereso NewcomerScott Weaver, PA. I advised I thought that would be fine.

## 2017-07-09 NOTE — Telephone Encounter (Signed)
-----   Message from Beatrice LecherScott T Weaver, PA-C sent at 07/09/2017  8:17 AM EDT ----- Creatinine stable.  The potassium is normal. PLAN:  1. Continue current medications and follow up as planned.  2. Repeat BMET 1 month.  Tereso NewcomerScott Weaver, PA-C    07/09/2017 8:16 AM

## 2017-07-12 DIAGNOSIS — I5033 Acute on chronic diastolic (congestive) heart failure: Secondary | ICD-10-CM | POA: Diagnosis not present

## 2017-07-12 DIAGNOSIS — I130 Hypertensive heart and chronic kidney disease with heart failure and stage 1 through stage 4 chronic kidney disease, or unspecified chronic kidney disease: Secondary | ICD-10-CM | POA: Diagnosis not present

## 2017-07-13 DIAGNOSIS — I5033 Acute on chronic diastolic (congestive) heart failure: Secondary | ICD-10-CM | POA: Diagnosis not present

## 2017-07-13 DIAGNOSIS — I130 Hypertensive heart and chronic kidney disease with heart failure and stage 1 through stage 4 chronic kidney disease, or unspecified chronic kidney disease: Secondary | ICD-10-CM | POA: Diagnosis not present

## 2017-07-15 DIAGNOSIS — I130 Hypertensive heart and chronic kidney disease with heart failure and stage 1 through stage 4 chronic kidney disease, or unspecified chronic kidney disease: Secondary | ICD-10-CM | POA: Diagnosis not present

## 2017-07-15 DIAGNOSIS — I5033 Acute on chronic diastolic (congestive) heart failure: Secondary | ICD-10-CM | POA: Diagnosis not present

## 2017-07-16 DIAGNOSIS — I5033 Acute on chronic diastolic (congestive) heart failure: Secondary | ICD-10-CM | POA: Diagnosis not present

## 2017-07-16 DIAGNOSIS — I130 Hypertensive heart and chronic kidney disease with heart failure and stage 1 through stage 4 chronic kidney disease, or unspecified chronic kidney disease: Secondary | ICD-10-CM | POA: Diagnosis not present

## 2017-07-20 DIAGNOSIS — I5033 Acute on chronic diastolic (congestive) heart failure: Secondary | ICD-10-CM | POA: Diagnosis not present

## 2017-07-20 DIAGNOSIS — I130 Hypertensive heart and chronic kidney disease with heart failure and stage 1 through stage 4 chronic kidney disease, or unspecified chronic kidney disease: Secondary | ICD-10-CM | POA: Diagnosis not present

## 2017-08-16 ENCOUNTER — Ambulatory Visit: Payer: Medicare Other | Admitting: Physician Assistant

## 2017-08-17 DIAGNOSIS — H353211 Exudative age-related macular degeneration, right eye, with active choroidal neovascularization: Secondary | ICD-10-CM | POA: Diagnosis not present

## 2017-08-17 DIAGNOSIS — H353223 Exudative age-related macular degeneration, left eye, with inactive scar: Secondary | ICD-10-CM | POA: Diagnosis not present

## 2017-08-17 DIAGNOSIS — H43813 Vitreous degeneration, bilateral: Secondary | ICD-10-CM | POA: Diagnosis not present

## 2017-08-17 DIAGNOSIS — I5032 Chronic diastolic (congestive) heart failure: Secondary | ICD-10-CM | POA: Diagnosis not present

## 2017-08-20 ENCOUNTER — Other Ambulatory Visit: Payer: Self-pay | Admitting: Nurse Practitioner

## 2017-08-20 ENCOUNTER — Ambulatory Visit
Admission: RE | Admit: 2017-08-20 | Discharge: 2017-08-20 | Disposition: A | Discharge status: Home / Self Care | Payer: Medicare Other | Source: Ambulatory Visit | Attending: Nurse Practitioner | Admitting: Nurse Practitioner

## 2017-08-20 DIAGNOSIS — J069 Acute upper respiratory infection, unspecified: Secondary | ICD-10-CM

## 2017-08-20 DIAGNOSIS — R05 Cough: Secondary | ICD-10-CM | POA: Diagnosis not present

## 2017-08-30 DIAGNOSIS — I5032 Chronic diastolic (congestive) heart failure: Secondary | ICD-10-CM | POA: Diagnosis not present

## 2017-08-30 DIAGNOSIS — Z79899 Other long term (current) drug therapy: Secondary | ICD-10-CM | POA: Diagnosis not present

## 2017-08-30 DIAGNOSIS — I481 Persistent atrial fibrillation: Secondary | ICD-10-CM | POA: Diagnosis not present

## 2017-09-21 ENCOUNTER — Ambulatory Visit (INDEPENDENT_AMBULATORY_CARE_PROVIDER_SITE_OTHER): Payer: Medicare Other | Admitting: Physician Assistant

## 2017-09-21 ENCOUNTER — Encounter: Payer: Self-pay | Admitting: Physician Assistant

## 2017-09-21 VITALS — BP 110/80 | HR 56 | Ht 64.0 in | Wt 161.8 lb

## 2017-09-21 DIAGNOSIS — I4819 Other persistent atrial fibrillation: Secondary | ICD-10-CM

## 2017-09-21 DIAGNOSIS — N183 Chronic kidney disease, stage 3 unspecified: Secondary | ICD-10-CM

## 2017-09-21 DIAGNOSIS — I5032 Chronic diastolic (congestive) heart failure: Secondary | ICD-10-CM

## 2017-09-21 DIAGNOSIS — I10 Essential (primary) hypertension: Secondary | ICD-10-CM | POA: Insufficient documentation

## 2017-09-21 DIAGNOSIS — I481 Persistent atrial fibrillation: Secondary | ICD-10-CM | POA: Diagnosis not present

## 2017-09-21 DIAGNOSIS — I340 Nonrheumatic mitral (valve) insufficiency: Secondary | ICD-10-CM | POA: Diagnosis not present

## 2017-09-21 LAB — BASIC METABOLIC PANEL
BUN/Creatinine Ratio: 20 (ref 12–28)
BUN: 24 mg/dL (ref 10–36)
CO2: 24 mmol/L (ref 20–29)
Calcium: 9.6 mg/dL (ref 8.7–10.3)
Chloride: 101 mmol/L (ref 96–106)
Creatinine, Ser: 1.21 mg/dL — ABNORMAL HIGH (ref 0.57–1.00)
GFR calc Af Amer: 45 mL/min/{1.73_m2} — ABNORMAL LOW (ref >59)
GFR calc non Af Amer: 39 mL/min/{1.73_m2} — ABNORMAL LOW (ref >59)
Glucose: 101 mg/dL — ABNORMAL HIGH (ref 65–99)
Potassium: 5 mmol/L (ref 3.5–5.2)
Sodium: 142 mmol/L (ref 134–144)

## 2017-09-21 LAB — CBC
Hematocrit: 45.8 % (ref 34.0–46.6)
Hemoglobin: 15.6 g/dL (ref 11.1–15.9)
MCH: 31.5 pg (ref 26.6–33.0)
MCHC: 34.1 g/dL (ref 31.5–35.7)
MCV: 92 fL (ref 79–97)
Platelets: 254 10*3/uL (ref 150–450)
RBC: 4.96 x10E6/uL (ref 3.77–5.28)
RDW: 14.5 % (ref 12.3–15.4)
WBC: 5.9 10*3/uL (ref 3.4–10.8)

## 2017-09-21 MED ORDER — LISINOPRIL 5 MG PO TABS: 5.0000 mg | ORAL_TABLET | Freq: Every day | ORAL | 3 refills | Status: DC

## 2017-09-21 NOTE — Patient Instructions (Signed)
Medication Instructions:  1. Your physician recommends that you continue on your current medications as directed. Please refer to the Current Medication list given to you today.   Labwork: TODAY BMET, CBC   Testing/Procedures: NONE ORDERED TODAY  Follow-Up: 6 MONTHS WITH DR. Delton SeeNELSON   Any Other Special Instructions Will Be Listed Below (If Applicable).     If you need a refill on your cardiac medications before your next appointment, please call your pharmacy.

## 2017-09-21 NOTE — Progress Notes (Signed)
Cardiology Office Note:    Date:  09/21/2017   ID:  Laura Diaz, DOB [birth year], MRN [id number]  PCP:  Kirby Funk, MD  Cardiologist:  Tobias Alexander, MD    Referring MD: Kirby Funk, MD   Chief Complaint  Patient presents with  . Follow-up    CHF, AFib    History of Present Illness:    Laura Diaz is a 84 y.o. female with diastolic heart failure, prior stroke, dementia.  She was admitted in May 2019 with decompensated heart failure and new onset atrial fibrillation.  Echocardiogram demonstrated normal LV function, mild to moderate mitral regurgitation and elevated PASP at 49.  She was last seen in June 2019.  Laura Diaz returns for follow-up.  She is here today with her daughter.  Since last seen, her primary care doctor increased her furosemide.  Since then, she has done much better.  Her weight is down 14 pounds.  Her breathing and lower extremity swelling are improved.  She still has some shortness of breath at times with minimal activity.  She denies PND, chest pain, syncope.  Prior CV studies:   The following studies were reviewed today:  Echo 06/23/17 Mild LVH, EF 60-65, normal wall motion, aortic sclerosis (no aortic stenosis), mild to moderate MR, moderate LAE, mildly reduced RVSF, patent foramen ovale with left-to-right shunting, mild to moderate TR, PASP 49  Echo 03/18/2014 Mild LVH, EF 55-60, normal wall motion, grade 1 diastolic dysfunction  Carotid US 03/18/2014 Summary: Bilateral: intimal wall thickening CCA. Mild mixed plaque origin ICA. 1-39% ICA stenosis. Vertebral artery flow is antegrade.  Past Medical History:  Diagnosis Date  . Chronic diastolic CHF (congestive heart failure) (HCC) 06/22/2017   Admx 5/19 with a/c diast CHF // Echo 5/19: Mild LVH, EF 60-65, normal wall motion, aortic sclerosis (no aortic stenosis), mild to moderate MR, moderate LAE, mildly reduced RVSF, patent foramen ovale with left-to-right shunting, mild to moderate TR, PASP  49 // Echo 02/2014:  Mild LVH, EF 55-60, normal wall motion, grade 1 diastolic dysfunction  . CKD (chronic kidney disease) stage 3, GFR 30-59 ml/min (HCC) 06/29/2017  . History of CVA (cerebrovascular accident) 11/28/2016  . Macular degeneration   . Mitral valve insufficiency 06/29/2017   Mild to mod by Echo 5/19  . Persistent atrial fibrillation (HCC) 06/22/2017  . PFO (patent foramen ovale)    noted on Echo 5/19   Surgical Hx: The patient  has a past surgical history that includes Hernia repair (years ago) and Partial hysterectomy (2006).   Current Medications: Current Meds  Medication Sig  . acetaminophen (TYLENOL) 325 MG tablet Take 650 mg by mouth daily as needed.  . Aflibercept (EYLEA) 2 MG/0.05ML SOLN 1 drop by Intravitreal route. Into the right eye every 2 months at physician's office   . apixaban (ELIQUIS) 2.5 MG TABS tablet Take 2.5 mg by mouth 2 (two) times daily.  . furosemide (LASIX) 40 MG tablet Take 40 mg by mouth daily.  Marland Kitchen lisinopril (PRINIVIL,ZESTRIL) 5 MG tablet Take 1 tablet (5 mg total) by mouth daily.  . Melatonin 3 MG TABS Take 3 mg by mouth at bedtime.  . potassium chloride (K-DUR) 10 MEQ tablet Take 1 tablet (10 mEq total) by mouth daily.  Marland Kitchen rOPINIRole (REQUIP) 1 MG tablet Take 1 mg by mouth at bedtime.   . Tetrahydrozoline HCl (VISINE OP) Place 1 drop into the right eye daily as needed (eye discomfort).  Marland Kitchen tobramycin (TOBREX) 0.3 % ophthalmic solution Place 1  drop into the right eye every 4 (four) hours. Use 1 day before and 2 days after procedure  . traZODone (DESYREL) 50 MG tablet Take 50 mg by mouth at bedtime.     Allergies:   Pollen extract   Social History   Tobacco Use  . Smoking status: Former Games developer  . Smokeless tobacco: Never Used  . Tobacco comment: many years ago  Substance Use Topics  . Alcohol use: No  . Drug use: No     Family Hx: The patient's family history includes Breast cancer in her sister; Hypertension in her son; Parkinson's disease  in her sister; Stroke in her father.  ROS:   Please see the history of present illness.    Review of Systems  Constitution: Positive for malaise/fatigue and weight loss.  HENT: Positive for hearing loss.   Eyes: Positive for visual disturbance.  Cardiovascular: Positive for dyspnea on exertion.  Respiratory: Positive for cough, shortness of breath and snoring.   Hematologic/Lymphatic: Bruises/bleeds easily.  Neurological: Positive for loss of balance.  Psychiatric/Behavioral: Positive for depression.   All other systems reviewed and are negative.   EKGs/Labs/Other Test Reviewed:    EKG:  EKG is   ordered today.  The ekg ordered today demonstrates atrial fibrillation, heart rate 56  Recent Labs: 06/22/2017: ALT 44; B Natriuretic Peptide 1,577.0; Magnesium 2.2; TSH 6.628 09/21/2017: BUN 24; Creatinine, Ser 1.21; Hemoglobin 15.6; Platelets 254; Potassium 5.0; Sodium 142   Recent Lipid Panel Lab Results  Component Value Date/Time   CHOL 209 (H) 03/18/2014 07:20 AM   TRIG 118 03/18/2014 07:20 AM   HDL 36 (L) 03/18/2014 07:20 AM   CHOLHDL 5.8 03/18/2014 07:20 AM   LDLCALC 149 (H) 03/18/2014 07:20 AM    Physical Exam:    VS:  BP 110/80   Pulse (!) 56   Ht 5\' 4"  (1.626 m)   Wt 161 lb 12.8 oz (73.4 kg)   SpO2 98%   BMI 27.77 kg/m     Wt Readings from Last 3 Encounters:  09/21/17 161 lb 12.8 oz (73.4 kg)  06/29/17 175 lb 12.8 oz (79.7 kg)  06/25/17 176 lb 4.8 oz (80 kg)     Physical Exam  Constitutional: She is oriented to person, place, and time. She appears well-developed and well-nourished. No distress.  HENT:  Head: Normocephalic and atraumatic.  Eyes: No scleral icterus.  Neck: No thyromegaly present.  Cardiovascular: Normal rate. An irregularly irregular rhythm present.  No murmur heard. Pulmonary/Chest: Effort normal. She has no wheezes. She has no rales.  Abdominal: Soft.  Musculoskeletal: She exhibits no edema.  Lymphadenopathy:    She has no cervical  adenopathy.  Neurological: She is alert and oriented to person, place, and time.  Skin: Skin is warm and dry.  Psychiatric: She has a normal mood and affect.    ASSESSMENT & PLAN:    Chronic diastolic CHF (congestive heart failure) (HCC) EF 60-65 by echocardiogram May 2019.  She is NYHA 3a.  Volume status is currently stable.  Continue current management.  Obtain follow-up BMET today.  Persistent atrial fibrillation (HCC) Heart rate is controlled.  She remains on lower dose Apixaban due to fall risk.  Obtain follow-up BMET, CBC today.  CKD (chronic kidney disease) stage 3, GFR 30-59 ml/min (HCC) Obtain follow-up BMET today.  Essential hypertension The patient's blood pressure is controlled on her current regimen.  Continue current therapy.   Mitral valve insufficiency, unspecified etiology Mild to moderate mitral regurgitation by echocardiogram May  2019.   Dispo:  Return in about 6 months (around 03/24/2018) for Routine Follow Up w/ Dr. Delton SeeNelson, or Tereso NewcomerScott Weaver, PA-C.   Medication Adjustments/Labs and Tests Ordered: Current medicines are reviewed at length with the patient today.  Concerns regarding medicines are outlined above.  Tests Ordered: Orders Placed This Encounter  Procedures  . Basic Metabolic Panel (BMET)  . CBC  . EKG 12-Lead   Medication Changes: Meds ordered this encounter  Medications  . lisinopril (PRINIVIL,ZESTRIL) 5 MG tablet    Sig: Take 1 tablet (5 mg total) by mouth daily.    Dispense:  90 tablet    Refill:  3    Order Specific Question:   Supervising Provider    Answer:   Corky CraftsVARANASI, JAYADEEP S [[id number]]    Signed, Tereso NewcomerScott Weaver, PA-C  09/21/2017 5:51 PM    [facility] [address], [place], [place]  [address] Phone: [phone]; Fax: [phone]

## 2017-09-22 ENCOUNTER — Telehealth: Payer: Self-pay | Admitting: *Deleted

## 2017-09-22 NOTE — Telephone Encounter (Signed)
-----   Message from Beatrice LecherScott T Weaver, [place]PA-C sent at 09/21/2017  6:00 PM EDT ----- The following labs are stable without significant clinical change:  Creatinine The potassium, hemoglobin are normal.  All other results are normal or within acceptable limits. Medication changes / Follow up labs / Other changes or recommendations:    - Continue current medications and follow up as planned.  Tereso NewcomerScott Weaver, PA-C 09/21/2017 5:59 PM

## 2017-09-22 NOTE — Telephone Encounter (Signed)
DPR ok to s/w pt's daughter Lupita LeashDonna who has been notified of pt's lab results by phone with verbal understanding. Lupita LeashDonna thanked me for the call and is aware continue on current Tx plan discussed at OV yesterday. I will forward a copy of results to PCP Dr. Kirby FunkJohn Griffin.

## 2017-11-02 DIAGNOSIS — Z23 Encounter for immunization: Secondary | ICD-10-CM | POA: Diagnosis not present

## 2017-11-02 DIAGNOSIS — Z5181 Encounter for therapeutic drug level monitoring: Secondary | ICD-10-CM | POA: Diagnosis not present

## 2017-11-02 DIAGNOSIS — I5032 Chronic diastolic (congestive) heart failure: Secondary | ICD-10-CM | POA: Diagnosis not present

## 2017-11-17 DIAGNOSIS — H353223 Exudative age-related macular degeneration, left eye, with inactive scar: Secondary | ICD-10-CM | POA: Diagnosis not present

## 2017-11-17 DIAGNOSIS — H353211 Exudative age-related macular degeneration, right eye, with active choroidal neovascularization: Secondary | ICD-10-CM | POA: Diagnosis not present

## 2017-11-17 DIAGNOSIS — H43813 Vitreous degeneration, bilateral: Secondary | ICD-10-CM | POA: Diagnosis not present

## 2018-02-08 DIAGNOSIS — I4819 Other persistent atrial fibrillation: Secondary | ICD-10-CM | POA: Diagnosis not present

## 2018-02-08 DIAGNOSIS — H900 Conductive hearing loss, bilateral: Secondary | ICD-10-CM | POA: Diagnosis not present

## 2018-02-08 DIAGNOSIS — Z1389 Encounter for screening for other disorder: Secondary | ICD-10-CM | POA: Diagnosis not present

## 2018-02-08 DIAGNOSIS — G2581 Restless legs syndrome: Secondary | ICD-10-CM | POA: Diagnosis not present

## 2018-02-08 DIAGNOSIS — Z0000 Encounter for general adult medical examination without abnormal findings: Secondary | ICD-10-CM | POA: Diagnosis not present

## 2018-02-08 DIAGNOSIS — N183 Chronic kidney disease, stage 3 (moderate): Secondary | ICD-10-CM | POA: Diagnosis not present

## 2018-02-08 DIAGNOSIS — F325 Major depressive disorder, single episode, in full remission: Secondary | ICD-10-CM | POA: Diagnosis not present

## 2018-02-08 DIAGNOSIS — F0390 Unspecified dementia without behavioral disturbance: Secondary | ICD-10-CM | POA: Diagnosis not present

## 2018-02-08 DIAGNOSIS — I5032 Chronic diastolic (congestive) heart failure: Secondary | ICD-10-CM | POA: Diagnosis not present

## 2018-06-24 ENCOUNTER — Other Ambulatory Visit: Payer: Self-pay | Admitting: Physician Assistant

## 2018-06-24 NOTE — Telephone Encounter (Signed)
Pt last saw Tereso Newcomer, PA on 09/21/17, last labs 09/21/17 Creat 1.21, age 85, weight 73.4kg, based on specified criteria pt is not on appropriate dosage of Eliquis.  However per [employer] last note in Epic from 09/21/17 it states pt is on low dose Eliquis given high fall risk.  Will refill Eliquis 2.5mg  BID.

## 2018-07-15 IMAGING — CR DG HIP (WITH OR WITHOUT PELVIS) 2-3V*L*
3 series · 3 of 3 positions shown · non-contrast
Comparison: None.

CLINICAL DATA: Acute left hip pain following fall today. Initial
encounter.

EXAM:
DG HIP (WITH OR WITHOUT PELVIS) 2-3V LEFT

[x pelvis]
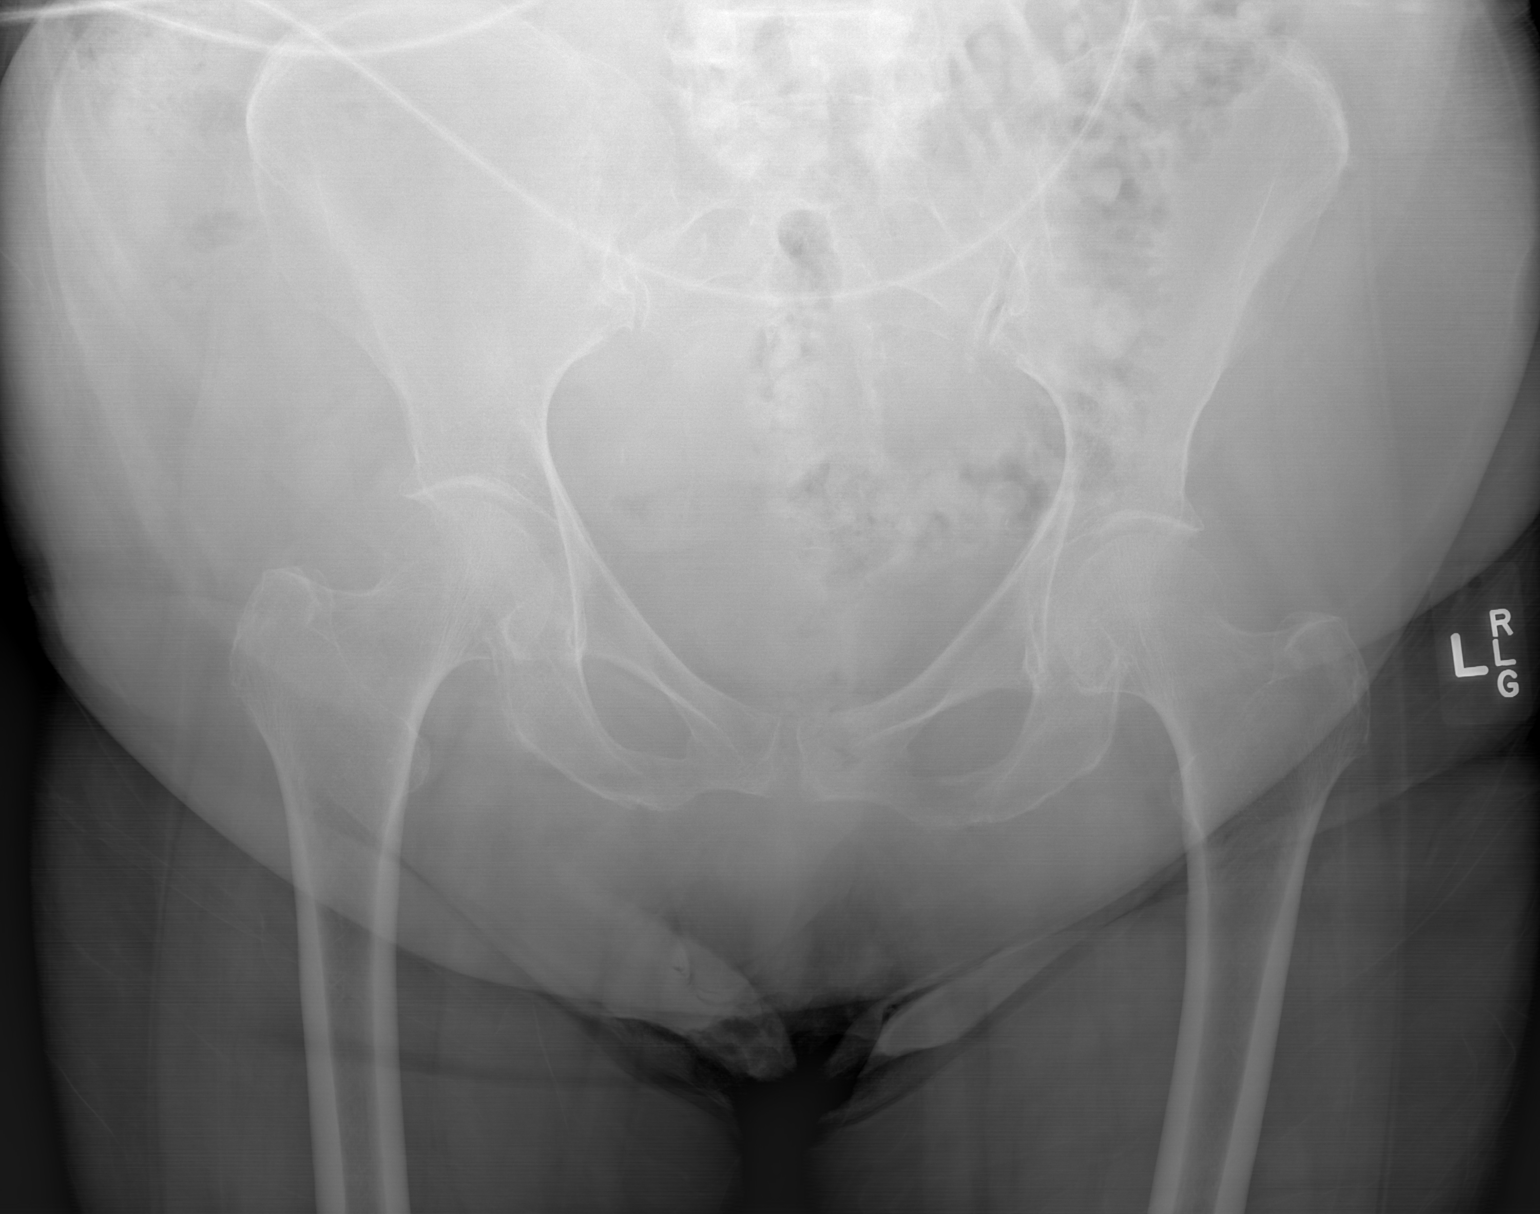

[x hip ap left]
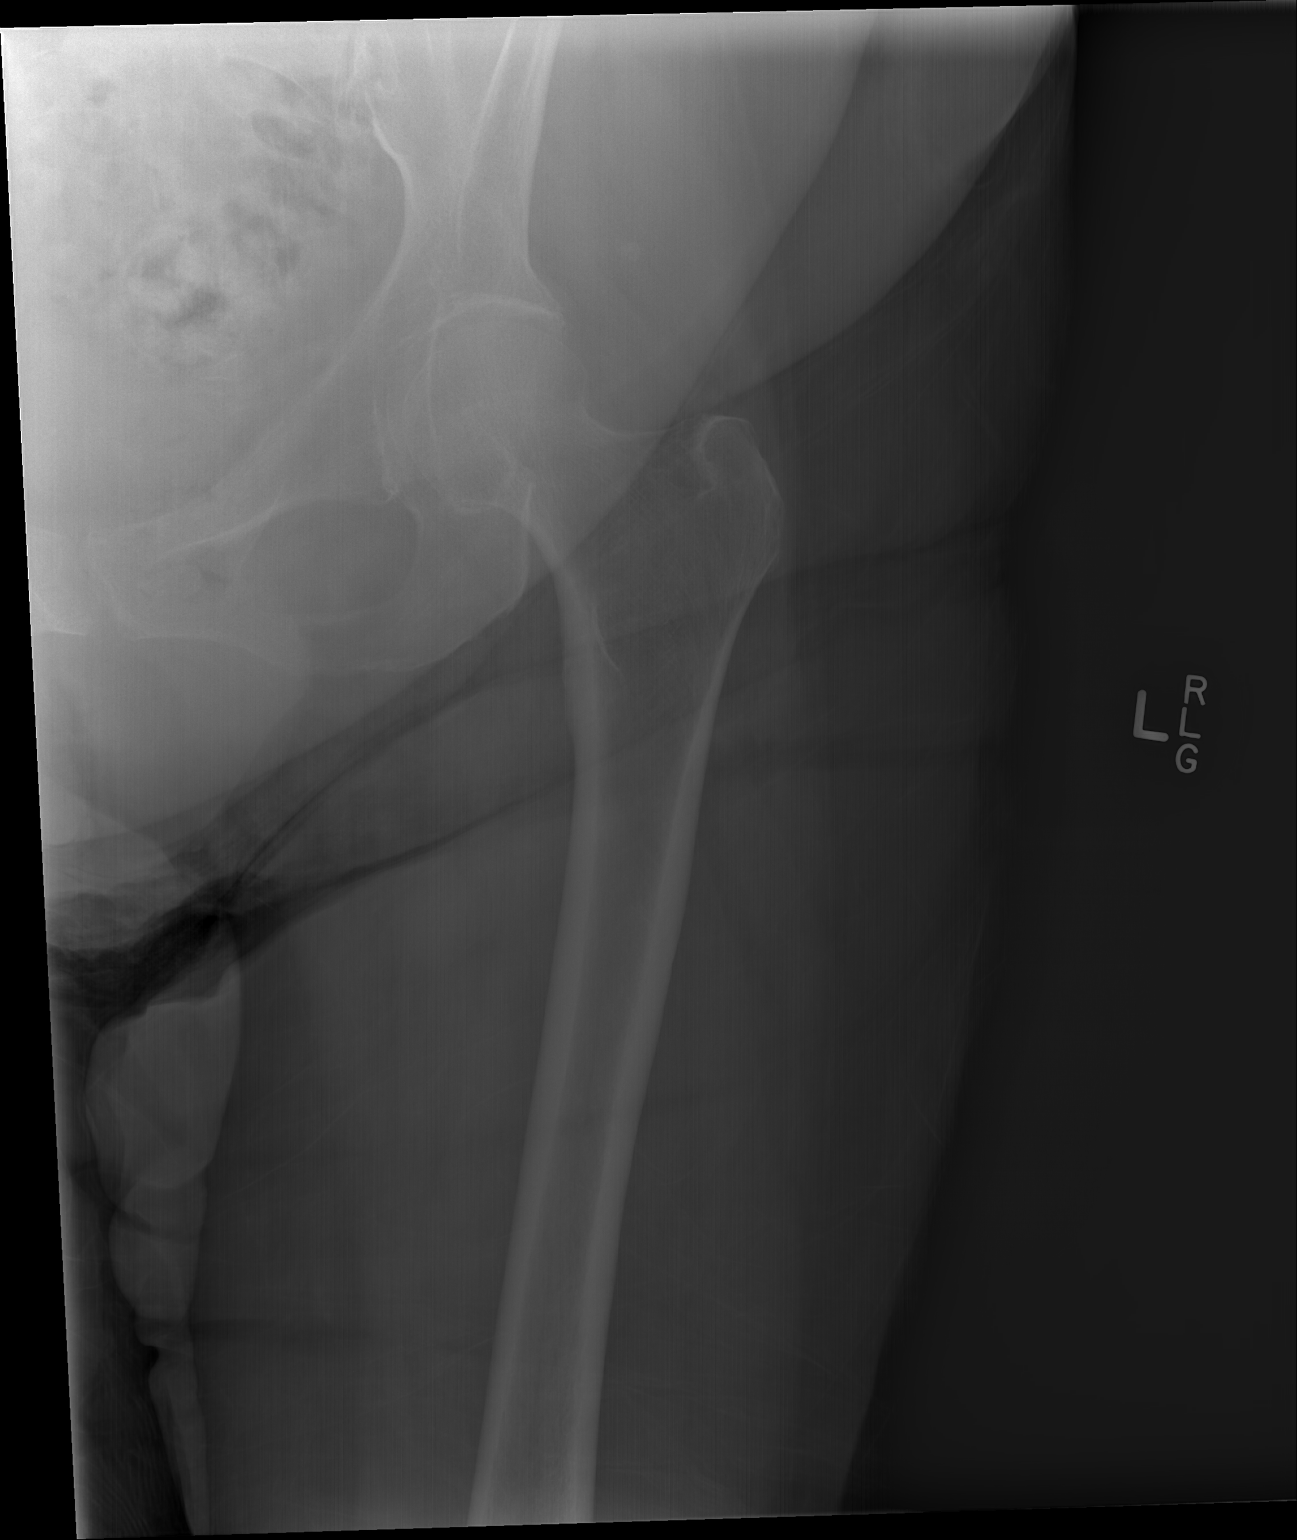

[x hip lat left]
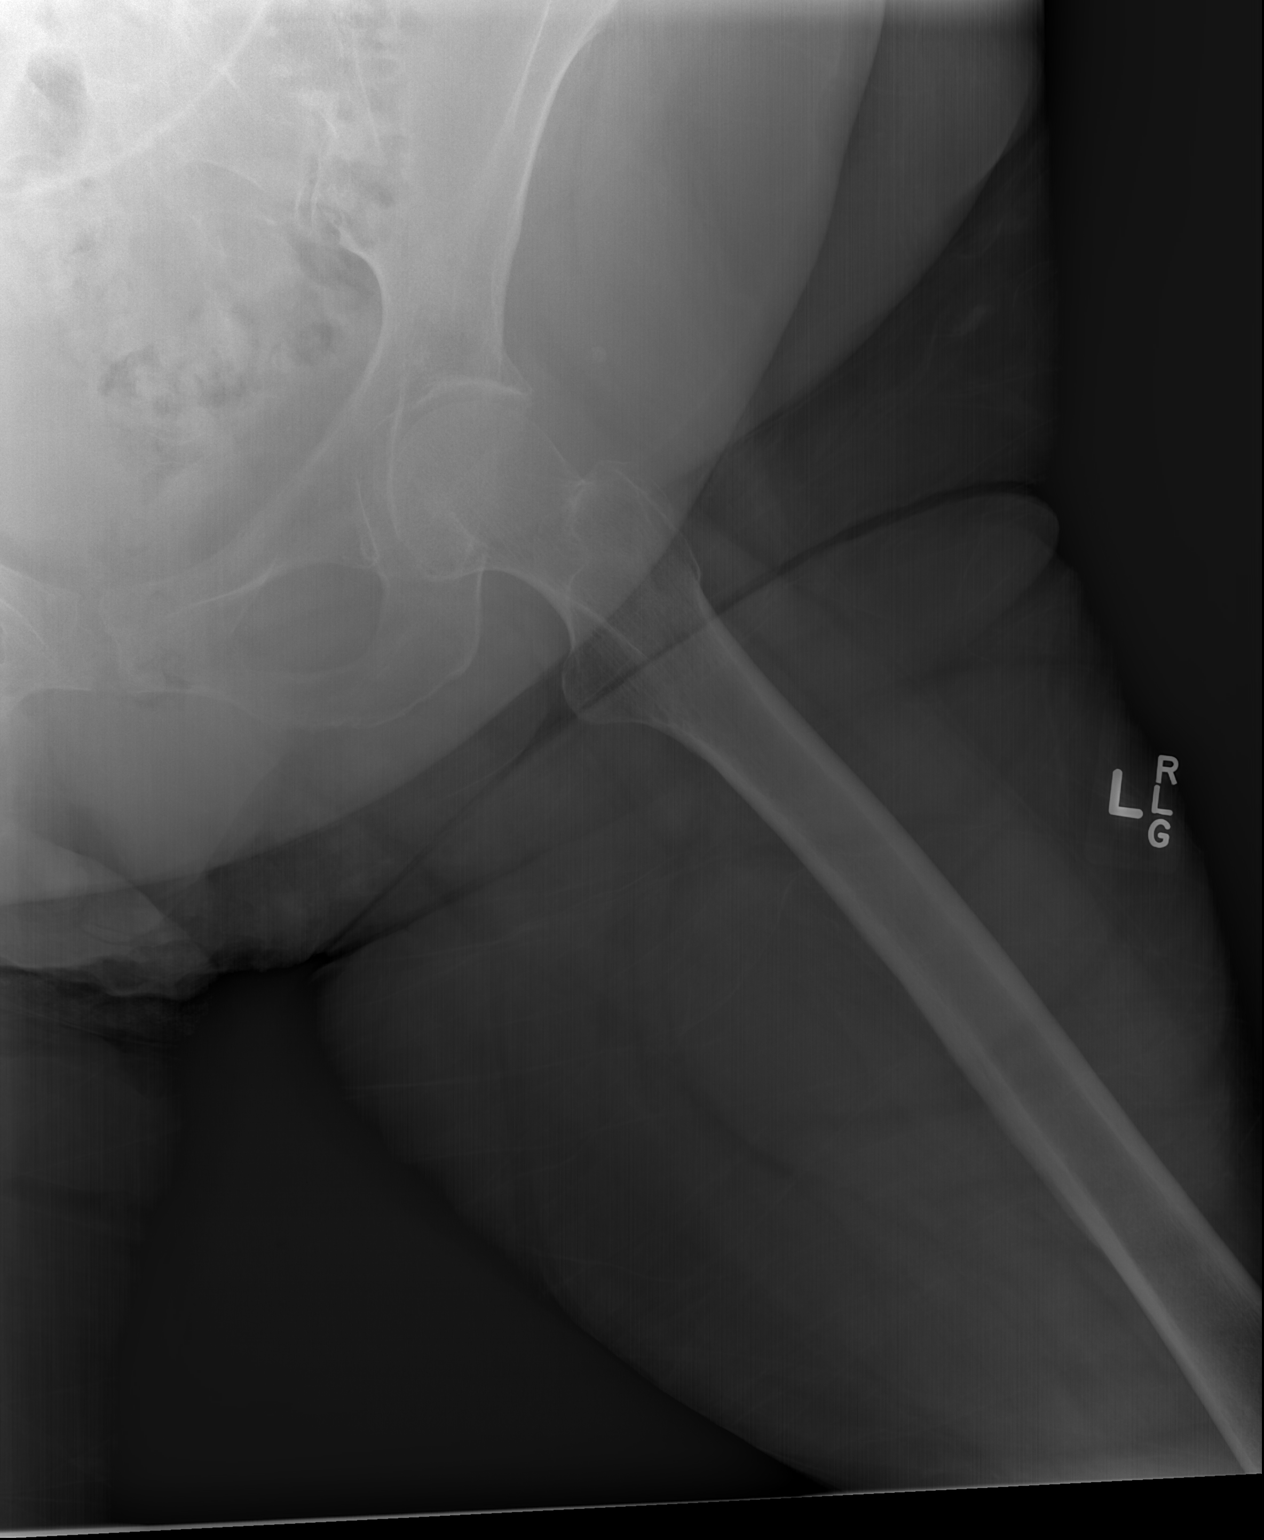

[3 of 3 positions shown; findings below may reference images not displayed]

FINDINGS: There is no evidence of hip fracture or dislocation. There is no
evidence of arthropathy or other focal bone abnormality.
IMPRESSION: Negative.

## 2018-07-15 IMAGING — CT CT HEAD W/O CM
3 of 8 series · 12 of 47 positions shown, 14 images · non-contrast
Comparison: 03/16/2014

CLINICAL DATA: Patient and was found on the floor by daughter. Head
and neck trauma. Patient takes Coumadin.

EXAM:
CT HEAD WITHOUT CONTRAST
CT CERVICAL SPINE WITHOUT CONTRAST
TECHNIQUE: Multidetector CT imaging of the head and cervical spine was
performed following the standard protocol without intravenous
contrast. Multiplanar CT image reconstructions of the cervical spine
were also generated.

[Series 7: coronal · coronal · 0.31mm/px · 3 of 70 slices shown]
[im 26/70  brain]
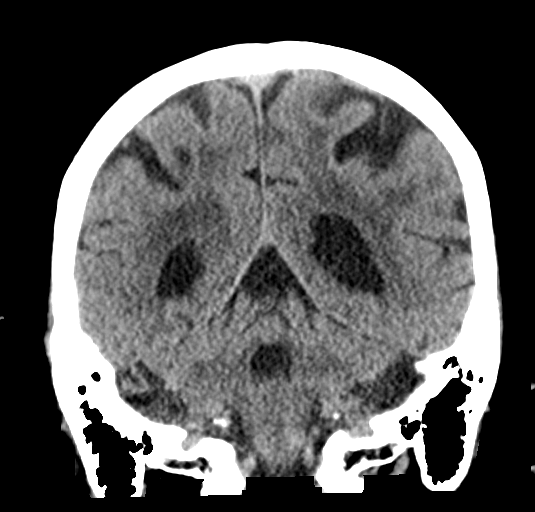
[im 35/70  brain]
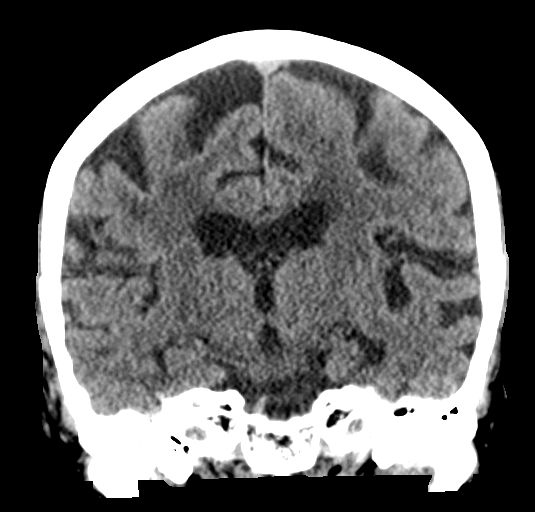
[im 44/70  brain]
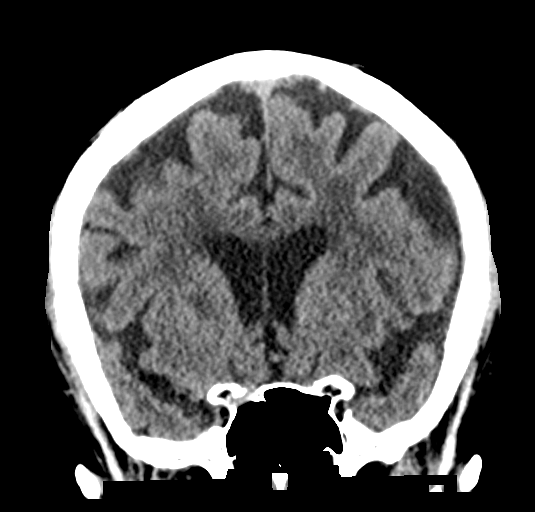

[Series 11: axial recon · axial · 0.19mm/px · z∈[+1419,+1544]mm · 7 of 96 slices shown, 9 images]
[im 10/96  brain]
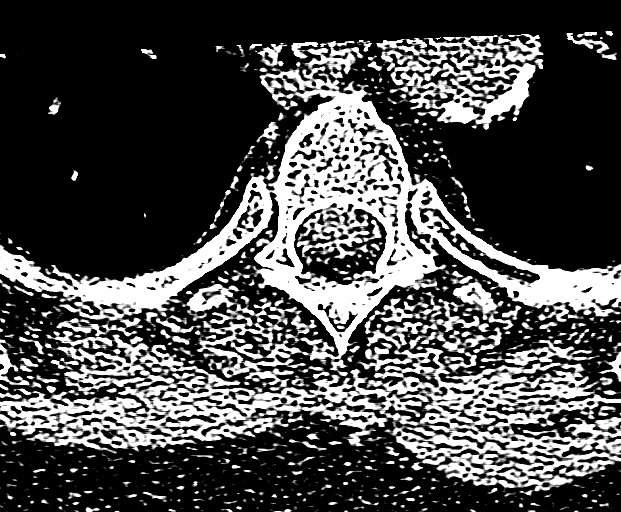
[im 10/96  bone]
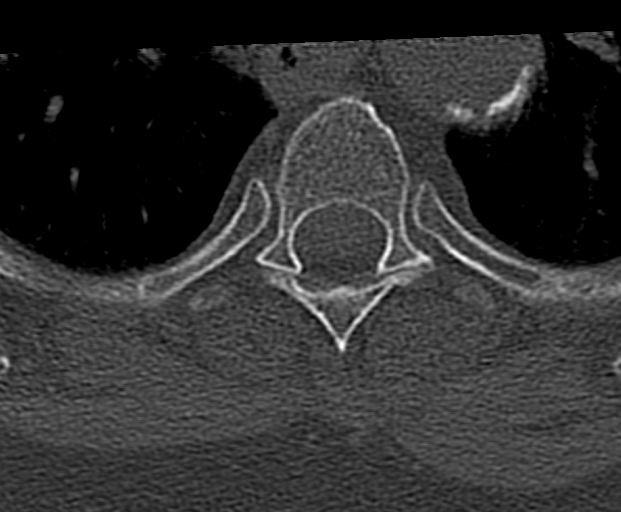
[im 20/96  brain]
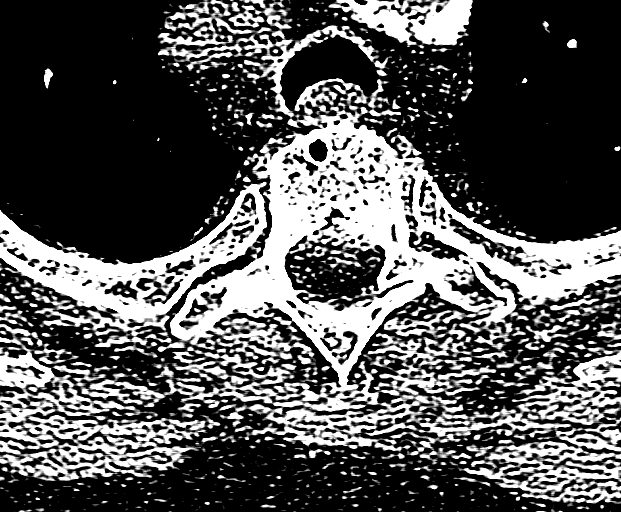
[im 39/96  brain]
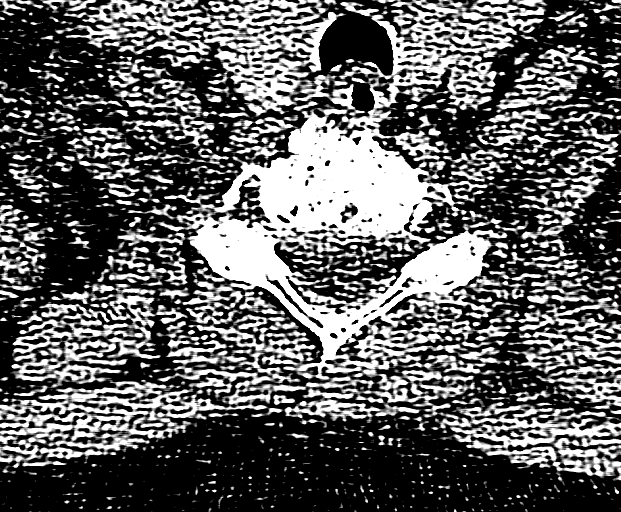
[im 48/96  brain]
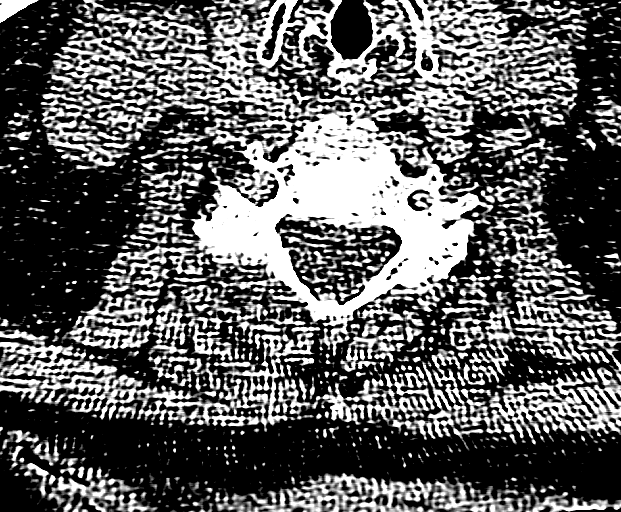
[im 58/96  brain]
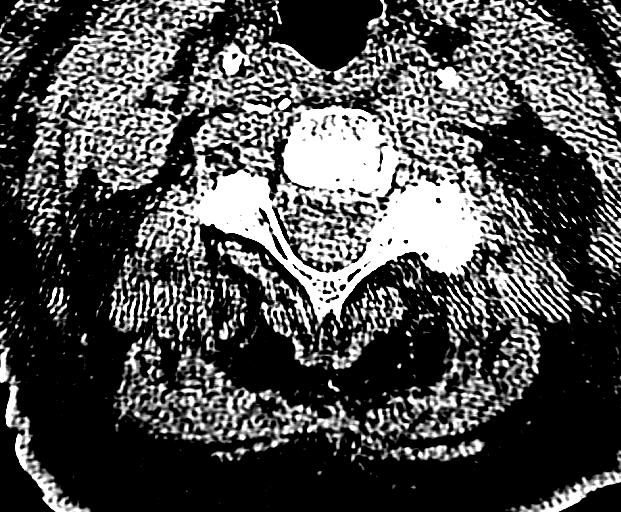
[im 58/96  bone]
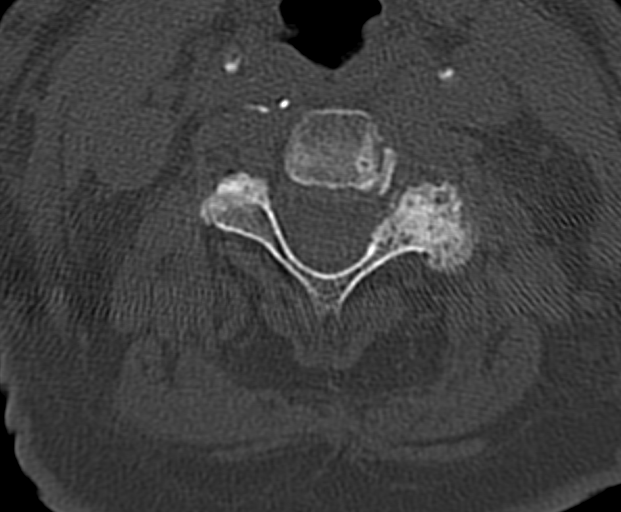
[im 77/96  brain]
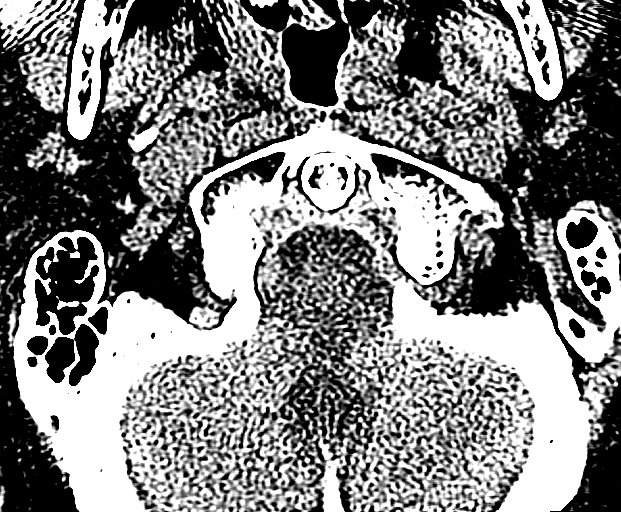
[im 86/96  brain]
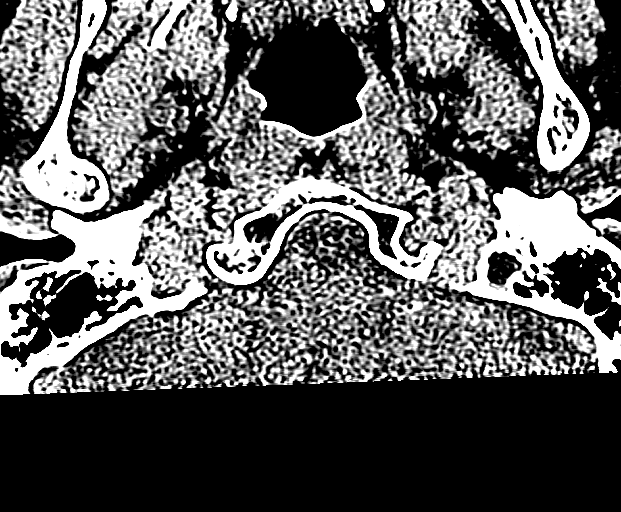

[Series 13: sagittal · sagittal · 0.19mm/px · 2 of 61 slices shown]
[im 21/61  brain]
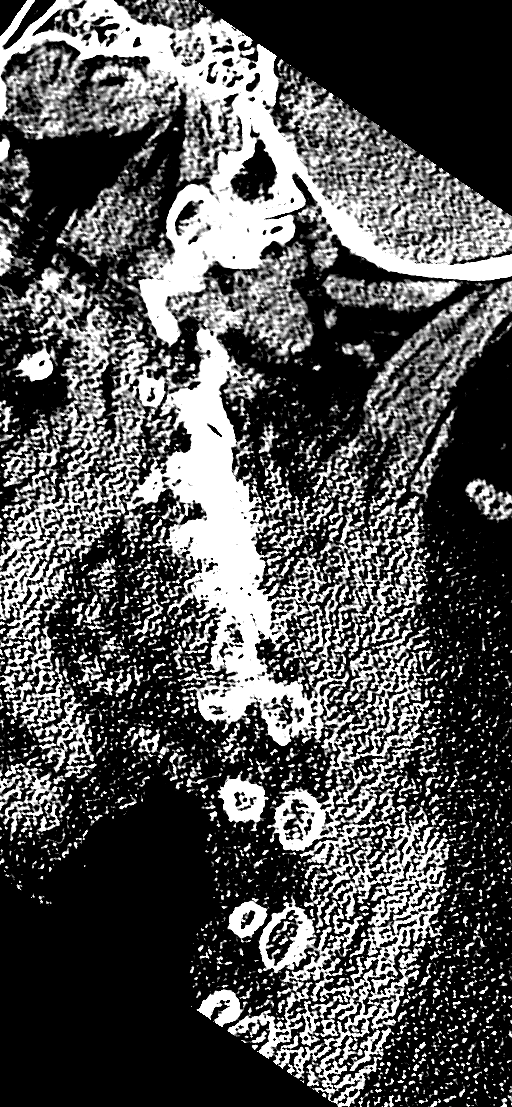
[im 41/61  brain]
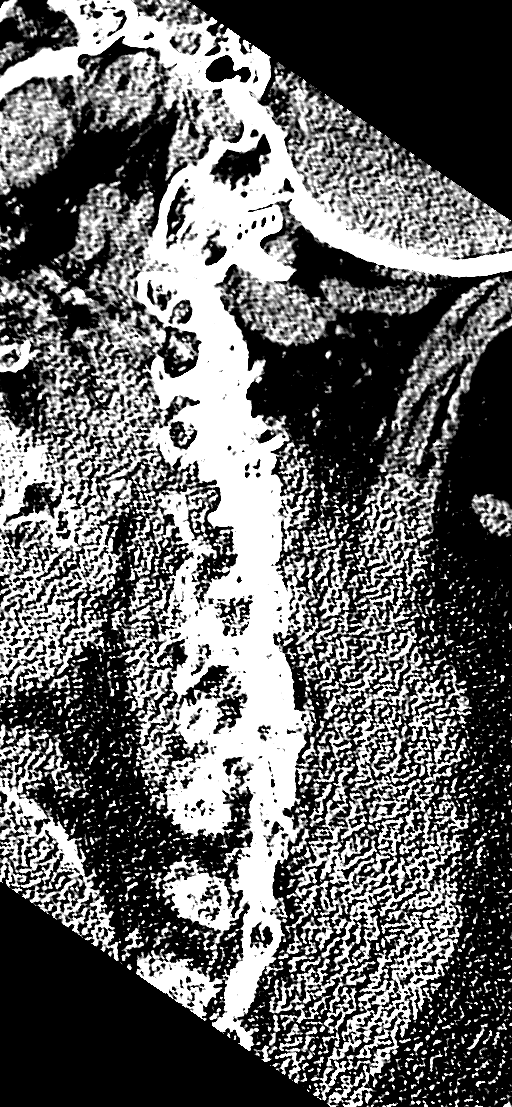

[12 of 47 positions shown; findings below may reference images not displayed]

FINDINGS: CT HEAD FINDINGS

BRAIN: There is sulcal and ventricular prominence consistent with
superficial and central atrophy. No intraparenchymal hemorrhage,
mass effect nor midline shift. Periventricular and subcortical white
matter hypodensities consistent with chronic small vessel ischemic
disease are identified. No acute large vascular territory infarcts.
No abnormal extra-axial fluid collections. Basal cisterns are not
effaced and midline.

VASCULAR: Mild to moderate calcific atherosclerosis of the carotid
siphons.

SKULL: No skull fracture. No significant scalp soft tissue swelling.

SINUSES/ORBITS: The mastoid air-cells are clear. The included
paranasal sinuses are well-aerated.The included ocular globes and
orbital contents are non-suspicious. Bilateral lens surgery.

OTHER: None.

CT CERVICAL SPINE FINDINGS

Alignment: There is reversal cervical lordosis with apex at C5-6
secondary to degenerative disc disease. Intact atlantodental
interval and craniocervical relationship. Anterolisthesis of C3 on
C4, C4 on C5 and C7 on T1 grade 1 likely on the basis of
degenerative disc and facet arthropathy.

Skull base and vertebrae: No acute fracture. No primary bone lesion
or focal pathologic process.

Soft tissues and spinal canal: No prevertebral fluid or swelling. No
visible canal hematoma.

Disc levels: No significant central canal stenosis.
Moderate-to-marked disc flattening C5-6, C6-7 and moderate at C7-T1
and T1-T2. Bilateral multilevel degenerative facet arthropathy C3
through C7 bilaterally with mild bilateral C5-6 and C6-7 neural
foraminal encroachment. Left C3-4 and bilateral uncovertebral joint
osteoarthritis with spurring at C5-6 and C6-7.

Upper chest: Moderate aortic atherosclerosis of the included aortic
arch and origins of the great vessels. Mild thyromegaly with
retrosternal extension. No acute pulmonary abnormality at the
apices. There is minimal scarring at the left lung apex.

Other: None
IMPRESSION: 1. Chronic moderate small vessel ischemic disease of periventricular
and deep white matter. No acute intracranial abnormality.
2. No acute cervical spine fracture.
3. Cervical spondylosis with marked disc space narrowing C5-6 and
C6-7. Associated bilateral neural foraminal mild encroachment from
osteophytes at these levels.
4. Degenerative anterolisthesis grade 1 of C3 on C4, C4 and C5 and
C7 on T1.

## 2018-10-04 DIAGNOSIS — H43813 Vitreous degeneration, bilateral: Secondary | ICD-10-CM | POA: Diagnosis not present

## 2018-10-04 DIAGNOSIS — H353223 Exudative age-related macular degeneration, left eye, with inactive scar: Secondary | ICD-10-CM | POA: Diagnosis not present

## 2018-10-04 DIAGNOSIS — H04123 Dry eye syndrome of bilateral lacrimal glands: Secondary | ICD-10-CM | POA: Diagnosis not present

## 2018-10-04 DIAGNOSIS — H353212 Exudative age-related macular degeneration, right eye, with inactive choroidal neovascularization: Secondary | ICD-10-CM | POA: Diagnosis not present

## 2018-11-29 DIAGNOSIS — H353223 Exudative age-related macular degeneration, left eye, with inactive scar: Secondary | ICD-10-CM | POA: Diagnosis not present

## 2018-11-29 DIAGNOSIS — H353211 Exudative age-related macular degeneration, right eye, with active choroidal neovascularization: Secondary | ICD-10-CM | POA: Diagnosis not present

## 2018-11-29 DIAGNOSIS — H35373 Puckering of macula, bilateral: Secondary | ICD-10-CM | POA: Diagnosis not present

## 2018-11-29 DIAGNOSIS — H43813 Vitreous degeneration, bilateral: Secondary | ICD-10-CM | POA: Diagnosis not present

## 2018-11-30 ENCOUNTER — Other Ambulatory Visit: Payer: Self-pay | Admitting: Physician Assistant

## 2018-11-30 DIAGNOSIS — I5032 Chronic diastolic (congestive) heart failure: Secondary | ICD-10-CM

## 2018-12-05 ENCOUNTER — Other Ambulatory Visit: Payer: Self-pay | Admitting: Physician Assistant

## 2018-12-05 DIAGNOSIS — I5032 Chronic diastolic (congestive) heart failure: Secondary | ICD-10-CM

## 2018-12-27 DIAGNOSIS — H353211 Exudative age-related macular degeneration, right eye, with active choroidal neovascularization: Secondary | ICD-10-CM | POA: Diagnosis not present

## 2018-12-27 DIAGNOSIS — H353223 Exudative age-related macular degeneration, left eye, with inactive scar: Secondary | ICD-10-CM | POA: Diagnosis not present

## 2019-03-14 DIAGNOSIS — H35373 Puckering of macula, bilateral: Secondary | ICD-10-CM | POA: Diagnosis not present

## 2019-03-14 DIAGNOSIS — H353222 Exudative age-related macular degeneration, left eye, with inactive choroidal neovascularization: Secondary | ICD-10-CM | POA: Diagnosis not present

## 2019-03-14 DIAGNOSIS — H43813 Vitreous degeneration, bilateral: Secondary | ICD-10-CM | POA: Diagnosis not present

## 2019-03-14 DIAGNOSIS — H353211 Exudative age-related macular degeneration, right eye, with active choroidal neovascularization: Secondary | ICD-10-CM | POA: Diagnosis not present

## 2019-06-21 DIAGNOSIS — R32 Unspecified urinary incontinence: Secondary | ICD-10-CM | POA: Diagnosis not present

## 2019-06-21 DIAGNOSIS — N183 Chronic kidney disease, stage 3 unspecified: Secondary | ICD-10-CM | POA: Diagnosis not present

## 2019-06-21 DIAGNOSIS — Z111 Encounter for screening for respiratory tuberculosis: Secondary | ICD-10-CM | POA: Diagnosis not present

## 2019-06-21 DIAGNOSIS — I4819 Other persistent atrial fibrillation: Secondary | ICD-10-CM | POA: Diagnosis not present

## 2019-06-21 DIAGNOSIS — Z9181 History of falling: Secondary | ICD-10-CM | POA: Diagnosis not present

## 2019-06-21 DIAGNOSIS — H6120 Impacted cerumen, unspecified ear: Secondary | ICD-10-CM | POA: Diagnosis not present

## 2019-06-21 DIAGNOSIS — Z7189 Other specified counseling: Secondary | ICD-10-CM | POA: Diagnosis not present

## 2019-06-21 DIAGNOSIS — H9190 Unspecified hearing loss, unspecified ear: Secondary | ICD-10-CM | POA: Diagnosis not present

## 2019-06-21 DIAGNOSIS — I5032 Chronic diastolic (congestive) heart failure: Secondary | ICD-10-CM | POA: Diagnosis not present

## 2019-06-23 DIAGNOSIS — H6123 Impacted cerumen, bilateral: Secondary | ICD-10-CM | POA: Diagnosis not present

## 2019-06-23 DIAGNOSIS — Z111 Encounter for screening for respiratory tuberculosis: Secondary | ICD-10-CM | POA: Diagnosis not present

## 2019-07-30 DIAGNOSIS — S0003XA Contusion of scalp, initial encounter: Secondary | ICD-10-CM | POA: Diagnosis not present

## 2019-07-30 DIAGNOSIS — R001 Bradycardia, unspecified: Secondary | ICD-10-CM | POA: Diagnosis not present

## 2019-07-30 DIAGNOSIS — W19XXXA Unspecified fall, initial encounter: Secondary | ICD-10-CM | POA: Diagnosis not present

## 2019-07-30 DIAGNOSIS — Z79899 Other long term (current) drug therapy: Secondary | ICD-10-CM | POA: Diagnosis not present

## 2019-07-30 DIAGNOSIS — M6281 Muscle weakness (generalized): Secondary | ICD-10-CM | POA: Diagnosis not present

## 2019-07-30 DIAGNOSIS — S0181XA Laceration without foreign body of other part of head, initial encounter: Secondary | ICD-10-CM | POA: Diagnosis not present

## 2019-07-30 DIAGNOSIS — I4891 Unspecified atrial fibrillation: Secondary | ICD-10-CM | POA: Diagnosis not present

## 2019-07-30 DIAGNOSIS — R58 Hemorrhage, not elsewhere classified: Secondary | ICD-10-CM | POA: Diagnosis not present

## 2019-07-30 DIAGNOSIS — Z23 Encounter for immunization: Secondary | ICD-10-CM | POA: Diagnosis not present

## 2019-07-30 DIAGNOSIS — Z9181 History of falling: Secondary | ICD-10-CM | POA: Diagnosis not present

## 2019-07-30 DIAGNOSIS — Z87891 Personal history of nicotine dependence: Secondary | ICD-10-CM | POA: Diagnosis not present

## 2019-07-30 DIAGNOSIS — Z7901 Long term (current) use of anticoagulants: Secondary | ICD-10-CM | POA: Diagnosis not present

## 2019-07-30 DIAGNOSIS — F329 Major depressive disorder, single episode, unspecified: Secondary | ICD-10-CM | POA: Diagnosis not present

## 2019-07-30 DIAGNOSIS — R4181 Age-related cognitive decline: Secondary | ICD-10-CM | POA: Diagnosis not present

## 2019-07-30 DIAGNOSIS — I10 Essential (primary) hypertension: Secondary | ICD-10-CM | POA: Diagnosis not present

## 2019-07-30 DIAGNOSIS — I110 Hypertensive heart disease with heart failure: Secondary | ICD-10-CM | POA: Diagnosis not present

## 2019-07-30 DIAGNOSIS — I5033 Acute on chronic diastolic (congestive) heart failure: Secondary | ICD-10-CM | POA: Diagnosis not present

## 2019-07-31 DIAGNOSIS — I5033 Acute on chronic diastolic (congestive) heart failure: Secondary | ICD-10-CM | POA: Diagnosis not present

## 2019-07-31 DIAGNOSIS — I083 Combined rheumatic disorders of mitral, aortic and tricuspid valves: Secondary | ICD-10-CM | POA: Diagnosis not present

## 2019-07-31 DIAGNOSIS — I517 Cardiomegaly: Secondary | ICD-10-CM | POA: Diagnosis not present

## 2019-08-01 DIAGNOSIS — G301 Alzheimer's disease with late onset: Secondary | ICD-10-CM | POA: Diagnosis not present

## 2019-08-01 DIAGNOSIS — R0602 Shortness of breath: Secondary | ICD-10-CM | POA: Diagnosis not present

## 2019-08-01 DIAGNOSIS — S0990XA Unspecified injury of head, initial encounter: Secondary | ICD-10-CM | POA: Diagnosis not present

## 2019-08-01 DIAGNOSIS — I4891 Unspecified atrial fibrillation: Secondary | ICD-10-CM | POA: Diagnosis not present

## 2019-08-01 DIAGNOSIS — F0280 Dementia in other diseases classified elsewhere without behavioral disturbance: Secondary | ICD-10-CM | POA: Diagnosis not present

## 2019-08-01 DIAGNOSIS — W19XXXA Unspecified fall, initial encounter: Secondary | ICD-10-CM | POA: Diagnosis not present

## 2019-08-01 DIAGNOSIS — Z515 Encounter for palliative care: Secondary | ICD-10-CM | POA: Diagnosis not present

## 2019-08-01 DIAGNOSIS — I5033 Acute on chronic diastolic (congestive) heart failure: Secondary | ICD-10-CM | POA: Diagnosis not present

## 2019-08-01 DIAGNOSIS — G9340 Encephalopathy, unspecified: Secondary | ICD-10-CM | POA: Diagnosis not present

## 2019-08-01 DIAGNOSIS — R110 Nausea: Secondary | ICD-10-CM | POA: Diagnosis not present

## 2019-08-01 DIAGNOSIS — R531 Weakness: Secondary | ICD-10-CM | POA: Diagnosis not present

## 2019-08-01 DIAGNOSIS — K5900 Constipation, unspecified: Secondary | ICD-10-CM | POA: Diagnosis not present

## 2019-08-02 DIAGNOSIS — I5033 Acute on chronic diastolic (congestive) heart failure: Secondary | ICD-10-CM | POA: Diagnosis not present

## 2019-08-03 DIAGNOSIS — I4891 Unspecified atrial fibrillation: Secondary | ICD-10-CM | POA: Diagnosis not present

## 2019-08-03 DIAGNOSIS — R110 Nausea: Secondary | ICD-10-CM | POA: Diagnosis not present

## 2019-08-03 DIAGNOSIS — I10 Essential (primary) hypertension: Secondary | ICD-10-CM | POA: Diagnosis not present

## 2019-08-03 DIAGNOSIS — F329 Major depressive disorder, single episode, unspecified: Secondary | ICD-10-CM | POA: Diagnosis not present

## 2019-08-03 DIAGNOSIS — I495 Sick sinus syndrome: Secondary | ICD-10-CM | POA: Diagnosis not present

## 2019-08-03 DIAGNOSIS — R0602 Shortness of breath: Secondary | ICD-10-CM | POA: Diagnosis not present

## 2019-08-03 DIAGNOSIS — G301 Alzheimer's disease with late onset: Secondary | ICD-10-CM | POA: Diagnosis not present

## 2019-08-03 DIAGNOSIS — I5033 Acute on chronic diastolic (congestive) heart failure: Secondary | ICD-10-CM | POA: Diagnosis not present

## 2019-08-03 DIAGNOSIS — R531 Weakness: Secondary | ICD-10-CM | POA: Diagnosis not present

## 2019-08-03 DIAGNOSIS — R001 Bradycardia, unspecified: Secondary | ICD-10-CM | POA: Diagnosis not present

## 2019-08-03 DIAGNOSIS — R4181 Age-related cognitive decline: Secondary | ICD-10-CM | POA: Diagnosis not present

## 2019-08-03 DIAGNOSIS — W19XXXA Unspecified fall, initial encounter: Secondary | ICD-10-CM | POA: Diagnosis not present

## 2019-08-03 DIAGNOSIS — G4700 Insomnia, unspecified: Secondary | ICD-10-CM | POA: Diagnosis not present

## 2019-08-03 DIAGNOSIS — Z515 Encounter for palliative care: Secondary | ICD-10-CM | POA: Diagnosis not present

## 2019-08-03 DIAGNOSIS — S0990XA Unspecified injury of head, initial encounter: Secondary | ICD-10-CM | POA: Diagnosis not present

## 2019-08-03 DIAGNOSIS — K5900 Constipation, unspecified: Secondary | ICD-10-CM | POA: Diagnosis not present

## 2019-08-03 DIAGNOSIS — G9340 Encephalopathy, unspecified: Secondary | ICD-10-CM | POA: Diagnosis not present

## 2019-08-03 DIAGNOSIS — F0280 Dementia in other diseases classified elsewhere without behavioral disturbance: Secondary | ICD-10-CM | POA: Diagnosis not present

## 2019-08-27 DEATH — deceased
